# Patient Record
Sex: Female | Born: 1990 | Race: Black or African American | Hispanic: No | Marital: Single | Smoking: Former smoker
Health system: Southern US, Community
[De-identification: ages and names within clinical notes are randomized; demographics above are authoritative.]

## PROBLEM LIST (undated history)

## (undated) DIAGNOSIS — B009 Herpesviral infection, unspecified: Secondary | ICD-10-CM

## (undated) HISTORY — PX: TOOTH EXTRACTION: SUR596

## (undated) HISTORY — PX: NO PAST SURGERIES: SHX2092

---
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
## 2009-01-24 ENCOUNTER — Emergency Department (HOSPITAL_COMMUNITY): Admission: EM | Admit: 2009-01-24 | Discharge: 2009-01-24 | Payer: Self-pay | Admitting: Emergency Medicine

## 2009-04-11 ENCOUNTER — Emergency Department (HOSPITAL_COMMUNITY): Admission: EM | Admit: 2009-04-11 | Discharge: 2009-04-11 | Payer: Self-pay | Admitting: Emergency Medicine

## 2009-08-24 ENCOUNTER — Emergency Department (HOSPITAL_COMMUNITY)
Admission: EM | Admit: 2009-08-24 | Discharge: 2009-08-24 | Payer: Self-pay | Source: Home / Self Care | Admitting: Family Medicine

## 2009-12-25 ENCOUNTER — Emergency Department (HOSPITAL_COMMUNITY)
Admission: EM | Admit: 2009-12-25 | Discharge: 2009-12-25 | Payer: Self-pay | Source: Home / Self Care | Admitting: Family Medicine

## 2010-02-02 ENCOUNTER — Emergency Department (HOSPITAL_COMMUNITY)
Admission: EM | Admit: 2010-02-02 | Discharge: 2010-02-02 | Payer: Self-pay | Source: Home / Self Care | Admitting: Emergency Medicine

## 2010-03-15 LAB — POCT PREGNANCY, URINE

## 2010-03-23 ENCOUNTER — Emergency Department (HOSPITAL_COMMUNITY)
Admission: EM | Admit: 2010-03-23 | Discharge: 2010-03-23 | Discharge status: Against Medical Advice | Payer: Self-pay | Attending: Emergency Medicine | Admitting: Emergency Medicine

## 2010-03-23 DIAGNOSIS — K1370 Unspecified lesions of oral mucosa: Secondary | ICD-10-CM | POA: Insufficient documentation

## 2010-04-09 LAB — URINALYSIS, ROUTINE W REFLEX MICROSCOPIC
Nitrite: NEGATIVE
Protein, ur: NEGATIVE mg/dL
Urobilinogen, UA: 1 mg/dL (ref 0.0–1.0)

## 2010-04-09 LAB — URINE MICROSCOPIC-ADD ON

## 2010-04-09 LAB — CBC
HCT: 40.7 % (ref 36.0–46.0)
Hemoglobin: 13.3 g/dL (ref 12.0–15.0)
RDW: 14 % (ref 11.5–15.5)
WBC: 9.6 10*3/uL (ref 4.0–10.5)

## 2010-04-10 LAB — CBC
HCT: 32.3 % — ABNORMAL LOW (ref 36.0–46.0)
Hemoglobin: 11.9 g/dL — ABNORMAL LOW (ref 12.0–15.0)
MCV: 87.2 fL (ref 78.0–100.0)
RBC: 3.7 MIL/uL — ABNORMAL LOW (ref 3.87–5.11)
RBC: 4.12 MIL/uL (ref 3.87–5.11)
WBC: 12.3 10*3/uL — ABNORMAL HIGH (ref 4.0–10.5)

## 2010-04-10 LAB — WET PREP, GENITAL: Clue Cells Wet Prep HPF POC: NONE SEEN

## 2010-04-14 LAB — URINALYSIS, ROUTINE W REFLEX MICROSCOPIC
Bilirubin Urine: NEGATIVE
Glucose, UA: NEGATIVE mg/dL
Ketones, ur: 15 mg/dL — AB
pH: 6.5 (ref 5.0–8.0)

## 2010-04-19 LAB — URINALYSIS, ROUTINE W REFLEX MICROSCOPIC
Glucose, UA: NEGATIVE mg/dL
Glucose, UA: NEGATIVE mg/dL
Hgb urine dipstick: NEGATIVE
Ketones, ur: NEGATIVE mg/dL
Ketones, ur: 15 mg/dL — AB
Nitrite: NEGATIVE
Nitrite: NEGATIVE
Specific Gravity, Urine: 1.021 (ref 1.005–1.030)
Specific Gravity, Urine: 1.014 (ref 1.005–1.030)
pH: 7.5 (ref 5.0–8.0)
pH: 6.5 (ref 5.0–8.0)

## 2010-04-19 LAB — URINE MICROSCOPIC-ADD ON

## 2010-04-19 LAB — URINE CULTURE: Colony Count: >=100000

## 2010-04-19 LAB — WET PREP, GENITAL: Clue Cells Wet Prep HPF POC: NONE SEEN

## 2010-04-19 LAB — GC/CHLAMYDIA PROBE AMP, GENITAL

## 2010-05-18 NOTE — Discharge Summary (Signed)
NAME:  Tonya Gonzales, Tonya Gonzales                 ACCOUNT NO.:  [id number]   MEDICAL RECORD NO.:  [id number]          PATIENT TYPE:  INP   LOCATION:  9130                          FACILITY:  [facility]   PHYSICIAN:  Sherron Monday, MD        DATE OF BIRTH:  Apr 23, 1990   DATE OF ADMISSION:  07/04/2006  DATE OF DISCHARGE:  07/06/2006                               DISCHARGE SUMMARY   ADMITTING DIAGNOSES:  1. Intrauterine pregnancy at term.  2. Spontaneous rupture of membranes.   DISCHARGE DIAGNOSES:  1. Intrauterine pregnancy at term.  2. Spontaneous rupture of membranes.  3. Delivered via spontaneous vaginal delivery.   HISTORY OF PRESENT ILLNESS:  A 20 year old G1 P0 at 37+2 weeks with SROM  at approximately 4 a.m.  She states she has been constantly leaking  clear fluid since this time.  Good fetal movement, no vaginal bleeding,  occasional contractions.  Her prenatal care was relatively uncomplicated  except early dilatation.  At 24 weeks, she was fingertip dilated.  At  this time, she had a negative fetal fibronectin and a good cervical  length by ultrasound; therefore, she was just followed with routine  care.   PAST MEDICAL HISTORY:  Asthma without hospitalization.   PAST SURGICAL HISTORY:  Not significant.   PAST OB/GYN HISTORY:  1. G1 is the present pregnancy.  2. She has a history of an abnormal Pap smear that was ascus with HPV      with the pregnancy and a normal colposcopy.  This will be followed      up postpartum.  3. 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   MEDICATIONS:  None.   ALLERGIES:  No known drug allergies.   SOCIAL HISTORY:  Denies alcohol, tobacco, or drug use.   FAMILY HISTORY:  Significant for maternal grandmother with coronary  artery disease, hypertension.  Maternal grandmother with lung cancer.  Paternal grandmother with breast cancer and diabetes in her maternal  grandparents.   PRENATAL LABS:  Hemoglobin 12.7, platelets 249,000,  blood type A  positive, antibody screen negative, sickle cell within normal limits,  gonorrhea negative, Chlamydia negative, RPR nonreactive, rubella immune,  hepatitis B surface antigen negative, Glucola 117, hepatitis B surface  antigen positive, quad screen negative.   Ultrasound on April 10, 2006, revealed a normal cervical length.  Ultrasound on February 27, 2006, at 19 and 1 weeks revealed an estimated  fetal weight of 311 g, normal anatomy, posterior placenta, and a female  infant.   PHYSICAL EXAM ON ADMISSION:  VITAL SIGNS:  She is afebrile with vital  signs stable.  GENERAL:  She is no apparent distress.  CARDIOVASCULAR:  Regular rate and rhythm.  LUNGS:  Clear to auscultation bilaterally.  ABDOMEN: soft, fundus nontender.  Fetal heart tones are in the 140s and  reactive.  Contractions are every 2 minutes.  VAGINAL EXAM:  4-5 cm dilated, 70% effaced, and -2 station.  Although  she had a bulging bag of water in the office, SROM had been confirmed by  sterile speculum exam with plus-minus  pool, plus-minus Nitrazine, and  positive fern.   She was admitted to labor and delivery, augmented with Pitocin, given  penicillin for GBS prophylaxis.  Her labor was relatively uncomplicated.  She dilated to complete-complete +2 at approximately 8:00, pushed well  to deliver a viable female infant at 2212 with Apgars of 8 at one minute  and 9 at five minutes and a weight of 7 pounds 12 ounces.  Secondary  perineal laceration repaired with 3-0 Vicryl in typical fashion.  EBL  was less than 50 mL.  The placenta was delivered intact at 2214.  Circumcision for the female infant was discussed with the patient and the  father of the baby who wished to proceed.  Her hospital course was  relatively uncomplicated.  She remained afebrile with stable vital signs  throughout.  On the day of discharge, postpartum day 2, her pain was  well controlled; she was ambulating well, and she had normal lochia.  At   this time, she was discharged to home with routine discharge  instructions and numbers to call if any questions or problems.  Her  hemoglobin decreased from 12.0 to 9.6.  She is A positive, rubella  immune.  She plans to breast feed and has worked with the Hotel manager, and her benefits will be checked for an IUD to be placed at  her postpartum checkup.  She was given prescriptions for Motrin,  prenatal vitamins, and Vicodin and voiced understanding to instructions.      Sherron Monday, MD  Electronically Signed     JB/MEDQ  D:  07/06/2006  T:  07/06/2006  Job:  [id number]

## 2010-09-30 LAB — URINALYSIS, ROUTINE W REFLEX MICROSCOPIC
Glucose, UA: NEGATIVE
Protein, ur: NEGATIVE

## 2010-09-30 LAB — URINE MICROSCOPIC-ADD ON

## 2010-09-30 LAB — WET PREP, GENITAL

## 2010-09-30 LAB — GC/CHLAMYDIA PROBE AMP, GENITAL: Chlamydia, DNA Probe: NEGATIVE

## 2010-10-04 LAB — URINALYSIS, ROUTINE W REFLEX MICROSCOPIC
Bilirubin Urine: NEGATIVE
Ketones, ur: NEGATIVE
Nitrite: NEGATIVE
Protein, ur: NEGATIVE
Urobilinogen, UA: 0.2

## 2010-10-04 LAB — CBC
HCT: 39.5
MCV: 87
RBC: 4.54
WBC: 6.2

## 2010-10-04 LAB — DIFFERENTIAL
Eosinophils Absolute: 0.5
Eosinophils Relative: 8 — ABNORMAL HIGH
Lymphs Abs: 1.9
Monocytes Relative: 8

## 2010-10-04 LAB — POCT I-STAT, CHEM 8
BUN: 6
Creatinine, Ser: 1
Glucose, Bld: 86
Hemoglobin: 13.9
Potassium: 3.6
TCO2: 23

## 2010-10-04 LAB — POCT PREGNANCY, URINE: Preg Test, Ur: NEGATIVE

## 2010-10-05 LAB — DIFFERENTIAL
Basophils Absolute: 0
Basophils Relative: 1
Eosinophils Absolute: 0.2
Eosinophils Relative: 4
Lymphocytes Relative: 42
Lymphs Abs: 2.3
Monocytes Absolute: 0.4
Monocytes Relative: 7
Neutro Abs: 2.6
Neutrophils Relative %: 47

## 2010-10-05 LAB — URINE MICROSCOPIC-ADD ON

## 2010-10-05 LAB — BASIC METABOLIC PANEL
BUN: 6
CO2: 23
Calcium: 9.2
Chloride: 106
Creatinine, Ser: 0.75
GFR calc Af Amer: >60
GFR calc non Af Amer: >60
Glucose, Bld: 93
Potassium: 3.8
Sodium: 137

## 2010-10-05 LAB — CBC
HCT: 37.7
Hemoglobin: 12.6
MCHC: 33.4
MCV: 85.9
Platelets: 280
RBC: 4.39
RDW: 13.2
WBC: 5.5

## 2010-10-05 LAB — URINALYSIS, ROUTINE W REFLEX MICROSCOPIC
Bilirubin Urine: NEGATIVE
Glucose, UA: NEGATIVE
Ketones, ur: 15 — AB
Nitrite: NEGATIVE
Protein, ur: NEGATIVE
Specific Gravity, Urine: 1.036 — ABNORMAL HIGH
Urobilinogen, UA: 1
pH: 6.5

## 2010-10-05 LAB — GC/CHLAMYDIA PROBE AMP, GENITAL
Chlamydia, DNA Probe: NEGATIVE
GC Probe Amp, Genital: NEGATIVE

## 2010-10-05 LAB — POCT PREGNANCY, URINE: Preg Test, Ur: NEGATIVE

## 2010-10-08 LAB — URINALYSIS, ROUTINE W REFLEX MICROSCOPIC
Ketones, ur: >80 mg/dL — AB
Leukocytes, UA: NEGATIVE
Nitrite: NEGATIVE
Protein, ur: 30 mg/dL — AB
pH: 6.5 (ref 5.0–8.0)

## 2010-10-08 LAB — URINE MICROSCOPIC-ADD ON

## 2010-10-08 LAB — POCT PREGNANCY, URINE: Preg Test, Ur: NEGATIVE

## 2010-10-19 LAB — RAPID HIV SCREEN (WH-MAU): Rapid HIV Screen: NONREACTIVE

## 2010-10-19 LAB — RPR: RPR Ser Ql: NONREACTIVE

## 2010-10-19 LAB — CBC
Hemoglobin: 12
MCHC: 33.1
MCHC: 33.7
MCV: 82.7
RBC: 4.37
RDW: 13.1
WBC: 8.5

## 2011-06-04 ENCOUNTER — Emergency Department (HOSPITAL_COMMUNITY): Payer: Self-pay

## 2011-06-04 ENCOUNTER — Encounter (HOSPITAL_COMMUNITY): Payer: Self-pay | Admitting: *Deleted

## 2011-06-04 ENCOUNTER — Emergency Department (HOSPITAL_COMMUNITY)
Admission: EM | Admit: 2011-06-04 | Discharge: 2011-06-05 | Disposition: A | Discharge status: Home / Self Care | Payer: Self-pay | Attending: Emergency Medicine | Admitting: Emergency Medicine

## 2011-06-04 DIAGNOSIS — J45909 Unspecified asthma, uncomplicated: Secondary | ICD-10-CM | POA: Insufficient documentation

## 2011-06-04 DIAGNOSIS — X58XXXA Exposure to other specified factors, initial encounter: Secondary | ICD-10-CM | POA: Insufficient documentation

## 2011-06-04 DIAGNOSIS — S39012A Strain of muscle, fascia and tendon of lower back, initial encounter: Secondary | ICD-10-CM

## 2011-06-04 DIAGNOSIS — Y9367 Activity, basketball: Secondary | ICD-10-CM | POA: Insufficient documentation

## 2011-06-04 DIAGNOSIS — S335XXA Sprain of ligaments of lumbar spine, initial encounter: Secondary | ICD-10-CM | POA: Insufficient documentation

## 2011-06-04 DIAGNOSIS — Y9239 Other specified sports and athletic area as the place of occurrence of the external cause: Secondary | ICD-10-CM | POA: Insufficient documentation

## 2011-06-04 MED ORDER — OXYCODONE-ACETAMINOPHEN 5-325 MG PO TABS: 1.0000 | ORAL_TABLET | Freq: Four times a day (QID) | ORAL | Status: AC | PRN

## 2011-06-04 MED ORDER — CYCLOBENZAPRINE HCL 10 MG PO TABS: 10.0000 mg | ORAL_TABLET | Freq: Two times a day (BID) | ORAL | Status: AC | PRN

## 2011-06-04 MED ORDER — OXYCODONE-ACETAMINOPHEN 5-325 MG PO TABS
1.0000 | ORAL_TABLET | Freq: Once | ORAL | Status: AC
  Administered 2011-06-04: 1 via ORAL
  Filled 2011-06-04: qty 1

## 2011-06-04 MED ORDER — CYCLOBENZAPRINE HCL 10 MG PO TABS
10.0000 mg | ORAL_TABLET | Freq: Once | ORAL | Status: AC
  Administered 2011-06-04: 10 mg via ORAL
  Filled 2011-06-04: qty 1

## 2011-06-04 NOTE — ED Notes (Signed)
C/o lower back pain after falling playing basketball  One hour ago.  She has painin her lower back and legs

## 2011-06-04 NOTE — ED Notes (Signed)
Patient transported to X-ray 

## 2011-06-04 NOTE — ED Provider Notes (Signed)
History     CSN: [id number]  Arrival date & time 06/04/11  2026   First MD Initiated Contact with Patient 06/04/11 2143      Chief Complaint  Patient presents with  . Back Pain    (Consider location/radiation/quality/duration/timing/severity/associated sxs/prior treatment) Patient is a 21 y.o. female presenting with back pain. The history is provided by the patient.  Back Pain  This is a new problem. The current episode started 3 to 5 hours ago. The problem occurs constantly. The problem has not changed since onset.Associated with: She was playing basketball and jumped to shoot a basket and when she came down she landed on her leg abnormally and had pain shooting from her back down to her toe. The pain is present in the lumbar spine. The quality of the pain is described as shooting and stabbing. The pain radiates to the right thigh. The pain is at a severity of 9/10. The pain is severe. The symptoms are aggravated by certain positions, twisting and bending. The pain is the same all the time. Associated symptoms include leg pain. Pertinent negatives include no chest pain, no numbness, no abdominal pain, no bowel incontinence, no dysuria, no paresthesias, no tingling and no weakness. She has tried NSAIDs and ice for the symptoms. The treatment provided no relief.    Past Medical History  Diagnosis Date  . Asthma     History reviewed. No pertinent past surgical history.  No family history on file.  History  Substance Use Topics  . Smoking status: Current Everyday Smoker  . Smokeless tobacco: Not on file  . Alcohol Use: Yes    OB History    Grav Para Term Preterm Abortions TAB SAB Ect Mult Living                  Review of Systems  Cardiovascular: Negative for chest pain.  Gastrointestinal: Negative for abdominal pain and bowel incontinence.  Genitourinary: Negative for dysuria.  Musculoskeletal: Positive for back pain.  Neurological: Negative for tingling, weakness,  numbness and paresthesias.  All other systems reviewed and are negative.    Allergies  Latex and Morphine and related  Home Medications   Current Outpatient Rx  Name Route Sig Dispense Refill  . ACETAMINOPHEN 325 MG PO TABS Oral Take 975 mg by mouth 2 (two) times daily as needed. For mouth pain      BP 124/77  Pulse 82  Temp(Src) 98.7 F (37.1 C) (Oral)  Resp 20  SpO2 100%  LMP 05/28/2011  Physical Exam  Nursing note and vitals reviewed. Constitutional: She is oriented to person, place, and time. She appears well-developed and well-nourished. She appears distressed.  HENT:  Head: Normocephalic and atraumatic.  Mouth/Throat: Oropharynx is clear and moist.  Eyes: Conjunctivae and EOM are normal. Pupils are equal, round, and reactive to light.  Neck: Normal range of motion. Neck supple.  Cardiovascular: Intact distal pulses.   Abdominal: Soft. She exhibits no distension. There is no tenderness. There is no rebound and no guarding.  Musculoskeletal: She exhibits no edema and no tenderness.       Lumbar back: She exhibits decreased range of motion, tenderness and spasm. She exhibits no bony tenderness, no swelling, no deformity and normal pulse.       Back:  Neurological: She is alert and oriented to person, place, and time. She has normal strength. No sensory deficit. Gait normal.  Skin: Skin is warm and dry. No rash noted. No erythema.  Psychiatric: She  has a normal mood and affect. Her behavior is normal.    ED Course  Procedures (including critical care time)  Labs Reviewed - No data to display Dg Lumbar Spine Complete  06/04/2011  *RADIOLOGY REPORT*  Clinical Data: Pain in the right side and right hip after basketball injury.  LUMBAR SPINE - COMPLETE 4+ VIEW  Comparison: None.  Findings: Five lumbar type vertebrae.  Normal alignment of the lumbar vertebrae and facet joints.  No vertebral compression deformities.  Intervertebral disc space heights are preserved.  No focal  bone lesion or bone destruction.  Bone cortex and trabecular architecture appear intact.  IMPRESSION: No displaced fractures identified.  Original Report Authenticated By: Marlon Pel, M.D.     No diagnosis found.    MDM   Patient was playing basketball tonight and when she came down on her leg she developed a severe pain in the right side of her back that has been ongoing for the last 3 hours. She has no neuro symptoms and she is neurovascularly intact. Normal muscle function and gait. 2+ pulses and normal capillary refill. Patient is point tender to the right paralumbar spine. Because this happened while she was playing basketball and did sustain an injury we'll get a plain film of the L-spine to further evaluate. She is not having any bowel or bladder incontinence.  11:33 PM Films are negative and pain is improved      Gwyneth Sprout, MD 06/04/11 2333

## 2011-06-04 NOTE — ED Notes (Signed)
Pt was playing basketball this evening.  She jumped up and came down, landing on her R foot.  When she landed, she felt a shooting pain start in her R foot and move up to her thigh and lower R back.  Denies losing control of bowels.  LMP 2 weeks prior and pt has implant.

## 2011-10-12 ENCOUNTER — Encounter (HOSPITAL_COMMUNITY): Payer: Self-pay | Admitting: *Deleted

## 2011-10-12 ENCOUNTER — Emergency Department (INDEPENDENT_AMBULATORY_CARE_PROVIDER_SITE_OTHER)
Admission: EM | Admit: 2011-10-12 | Discharge: 2011-10-12 | Disposition: A | Discharge status: Home / Self Care | Payer: Self-pay | Source: Home / Self Care | Attending: Emergency Medicine | Admitting: Emergency Medicine

## 2011-10-12 DIAGNOSIS — N730 Acute parametritis and pelvic cellulitis: Secondary | ICD-10-CM

## 2011-10-12 DIAGNOSIS — K0530 Chronic periodontitis, unspecified: Secondary | ICD-10-CM

## 2011-10-12 DIAGNOSIS — K0401 Reversible pulpitis: Secondary | ICD-10-CM

## 2011-10-12 LAB — POCT URINALYSIS DIP (DEVICE)
Bilirubin Urine: NEGATIVE
Ketones, ur: 15 mg/dL — AB
Protein, ur: NEGATIVE mg/dL
Specific Gravity, Urine: 1.015 (ref 1.005–1.030)
pH: 7 (ref 5.0–8.0)

## 2011-10-12 LAB — WET PREP, GENITAL
Trich, Wet Prep: NONE SEEN
Yeast Wet Prep HPF POC: NONE SEEN

## 2011-10-12 LAB — POCT PREGNANCY, URINE: Preg Test, Ur: NEGATIVE

## 2011-10-12 MED ORDER — CEFTRIAXONE SODIUM 250 MG IJ SOLR
INTRAMUSCULAR | Status: AC
  Filled 2011-10-12: qty 250

## 2011-10-12 MED ORDER — AZITHROMYCIN 250 MG PO TABS
ORAL_TABLET | ORAL | Status: AC
  Filled 2011-10-12: qty 4

## 2011-10-12 MED ORDER — AZITHROMYCIN 250 MG PO TABS
1000.0000 mg | ORAL_TABLET | Freq: Once | ORAL | Status: AC
  Administered 2011-10-12: 1000 mg via ORAL

## 2011-10-12 MED ORDER — METRONIDAZOLE 500 MG PO TABS: 500.0000 mg | ORAL_TABLET | Freq: Three times a day (TID) | ORAL | Status: DC

## 2011-10-12 MED ORDER — PENICILLIN V POTASSIUM 500 MG PO TABS: 500.0000 mg | ORAL_TABLET | Freq: Three times a day (TID) | ORAL | Status: DC

## 2011-10-12 MED ORDER — CEFTRIAXONE SODIUM 250 MG IJ SOLR
250.0000 mg | Freq: Once | INTRAMUSCULAR | Status: AC
  Administered 2011-10-12: 250 mg via INTRAMUSCULAR

## 2011-10-12 NOTE — ED Provider Notes (Signed)
Chief Complaint  Patient presents with  . Dental Pain    History of Present Illness:    The patient is a 21 year old female who comes in today, thinking she might be pregnant and also for some dental pain.  Her last menstrual period was September 9 and lasted 10 days. She is sexually active and has an Implanad implant which expired September 2. The past week she's had intermittent abdominal pain in the midline lower bowel area, worse when she sits and not better with anything. It's described as a crampy pain rated 6-7/10 in intensity at the most and now is a 0. She also has had intermittent diarrhea for the past week, urinary frequency, and a clear discharge. She's had nausea and vomiting, fatigue, mood swings, breast swelling and tenderness. She took a pregnancy test that was negative.  She also has had a six-day history of pain in the left upper and lower molars. She has pain with chewing. No swelling of the gingiva or the cheek. No trouble swallowing or breathing.  Review of Systems:  Other than noted above, the patient denies any of the following symptoms: Constitutional:  No fever, chills, fatigue, weight loss or anorexia. Lungs:  No cough or shortness of breath. Heart:  No chest pain, palpitations, syncope or edema.  No cardiac history. Abdomen:  No nausea, vomiting, hematememesis, melena, diarrhea, or hematochezia. GU:  No dysuria, frequency, urgency, or hematuria. Gyn:  No vaginal discharge, itching, abnormal bleeding, dyspareunia, or pelvic pain.  PMFSH:  Past medical history, family history, social history, meds, and allergies were reviewed along with nurse's notes.  No prior abdominal surgeries, past history of GI problems, STDs or GYN problems.  No history of aspirin or NSAID use.  No excessive alcohol intake.  Physical Exam:   Vital signs:  BP 133/77  Pulse 63  Temp 98.9 F (37.2 C) (Oral)  Resp 18  SpO2 100%  LMP 09/12/2011 Gen:  Alert, oriented, in no distress. ENT: She  has a partially erupted wisdom tooth in the left upper dentition which is tender to touch. She also has a cavity of the left lower second molar which is tender. Lungs:  Breath sounds clear and equal bilaterally.  No wheezes, rales or rhonchi. Heart:  Regular rhythm.  No gallops or murmers.   Abdomen:  Soft, flat, and nondistended. There is slight suprapubic tenderness to palpation. No guarding or rebound. No organomegaly or mass. Bowel sounds are normally active. Pelvic:  Normal external genitalia, vaginal and cervical mucosa are normal. She has moderate pain on cervical motion, moderate pain to palpation of the uterus, and moderate adnexal tenderness to palpation bilaterally without a mass. Skin:  Clear, warm and dry.  No rash.  Labs:   Results for orders placed during the hospital encounter of 10/12/11  WET PREP, GENITAL      Component Value Range   Yeast Wet Prep HPF POC NONE SEEN  NONE SEEN   Trich, Wet Prep NONE SEEN  NONE SEEN   Clue Cells Wet Prep HPF POC MODERATE (*) NONE SEEN   WBC, Wet Prep HPF POC NONE SEEN  NONE SEEN  POCT URINALYSIS DIP (DEVICE)      Component Value Range   Glucose, UA NEGATIVE  NEGATIVE mg/dL   Bilirubin Urine NEGATIVE  NEGATIVE   Ketones, ur 15 (*) NEGATIVE mg/dL   Specific Gravity, Urine 1.015  1.005 - 1.030   Hgb urine dipstick TRACE (*) NEGATIVE   pH 7.0  5.0 - 8.0  Protein, ur NEGATIVE  NEGATIVE mg/dL   Urobilinogen, UA 2.0 (*) 0.0 - 1.0 mg/dL   Nitrite NEGATIVE  NEGATIVE   Leukocytes, UA NEGATIVE  NEGATIVE  POCT PREGNANCY, URINE      Component Value Range   Preg Test, Ur NEGATIVE  NEGATIVE    Other Labs Obtained at Urgent Care Center:  GC and Chlamydia DNA probes were obtained.  Results are pending at this time and we will call about any positive results.  Course in Urgent Care Center:   She was given Rocephin 250 mg IM and azithromycin 1000 mg by mouth and tolerated this well without any immediate side effects.  Assessment:  The primary  encounter diagnosis was PID (acute pelvic inflammatory disease). Diagnoses of Pulpitis and Pericoronitis were also pertinent to this visit.  Plan:   1.  The following meds were prescribed:   New Prescriptions   METRONIDAZOLE (FLAGYL) 500 MG TABLET    Take 1 tablet (500 mg total) by mouth 3 (three) times daily.   PENICILLIN V POTASSIUM (VEETID) 500 MG TABLET    Take 1 tablet (500 mg total) by mouth 3 (three) times daily.   2.  The patient was instructed in symptomatic care and handouts were given. 3.  The patient was told to return if becoming worse in any way, to return for a recheck in 2 days, and given some red flag symptoms that would indicate earlier return.    Reuben Likes, MD 10/12/11 [phone]

## 2011-10-12 NOTE — ED Notes (Signed)
Pt  Has  A  Toothache  As well  As  Symptoms  Of  Pre  Which  Include    Weakness  Low  Back pain as  Well  As  Low abd  Pain     With  The  Symptoms  X  1  Week        denys  Any  Bleeding          She  Reports  She  Took  Home  preg  Test  Which  Was  Neg     lmp  1  Month  Ago

## 2011-10-13 ENCOUNTER — Telehealth (HOSPITAL_COMMUNITY): Payer: Self-pay | Admitting: *Deleted

## 2011-10-13 NOTE — ED Notes (Signed)
GC neg., Chlamydia pos., Wet prep: mod. clue cells.  Pt. adequately treated with Zithromax and Flagyl.  I called pt. and VM was not set up. I called contact Mom and left a message to have pt. call.  Pt. called back in a few minutes.  Pt. verified x 2 and given results.  Pt. told she was adequately treated for Chlamydia with the Zithromax and the bacterial vaginosis with the Flagyl.  Pt. instructed to notify their partner, no sex for 1 week and to practice safe sex. Pt. told they can get HIV testing at the [facility]. STD clinic, by appointment.  [facility]HS form completed and faxed to the [facility]. Vassie Moselle 10/13/2011

## 2011-11-04 ENCOUNTER — Emergency Department (HOSPITAL_COMMUNITY)
Admission: EM | Admit: 2011-11-04 | Discharge: 2011-11-04 | Disposition: A | Discharge status: Home / Self Care | Payer: Self-pay | Attending: Emergency Medicine | Admitting: Emergency Medicine

## 2011-11-04 ENCOUNTER — Encounter (HOSPITAL_COMMUNITY): Payer: Self-pay | Admitting: Family Medicine

## 2011-11-04 DIAGNOSIS — K089 Disorder of teeth and supporting structures, unspecified: Secondary | ICD-10-CM | POA: Insufficient documentation

## 2011-11-04 DIAGNOSIS — Z91040 Latex allergy status: Secondary | ICD-10-CM | POA: Insufficient documentation

## 2011-11-04 DIAGNOSIS — J45909 Unspecified asthma, uncomplicated: Secondary | ICD-10-CM | POA: Insufficient documentation

## 2011-11-04 DIAGNOSIS — K0889 Other specified disorders of teeth and supporting structures: Secondary | ICD-10-CM

## 2011-11-04 DIAGNOSIS — F17200 Nicotine dependence, unspecified, uncomplicated: Secondary | ICD-10-CM | POA: Insufficient documentation

## 2011-11-04 DIAGNOSIS — Z885 Allergy status to narcotic agent status: Secondary | ICD-10-CM | POA: Insufficient documentation

## 2011-11-04 MED ORDER — ACETAMINOPHEN 325 MG PO TABS
650.0000 mg | ORAL_TABLET | Freq: Once | ORAL | Status: AC
  Administered 2011-11-04: 650 mg via ORAL
  Filled 2011-11-04: qty 2

## 2011-11-04 MED ORDER — PENICILLIN V POTASSIUM 250 MG/5ML PO SOLR: 500.0000 mg | Freq: Four times a day (QID) | ORAL | Status: AC

## 2011-11-04 MED ORDER — PENICILLIN V POTASSIUM 500 MG PO TABS: 500.0000 mg | ORAL_TABLET | Freq: Four times a day (QID) | ORAL | Status: AC

## 2011-11-04 MED ORDER — OXYCODONE-ACETAMINOPHEN 5-325 MG PO TABS: 1.0000 | ORAL_TABLET | ORAL | Status: DC | PRN

## 2011-11-04 NOTE — ED Provider Notes (Signed)
History   This chart was scribed for Flint Melter, MD by Charolett Bumpers . The patient was seen in room TR08C/TR08C. Patient's care was started at 1115.   CSN: [id number]  Arrival date & time 11/04/11  1036   First MD Initiated Contact with Patient 11/04/11 1115      Chief Complaint  Patient presents with  . Dental Pain    The history is provided by the patient. No language interpreter was used.   Tonya Gonzales is a 21 y.o. female who presents to the Emergency Department complaining of left upper dental pain that worsened last night around 2200 while she was eating chips. She states that the tooth is broken. She denies any pain with opening her mouth, fevers, chills, nausea, vomiting. She reports that she was last seen by a dentist in 2012.   Past Medical History  Diagnosis Date  . Asthma     History reviewed. No pertinent past surgical history.  No family history on file.  History  Substance Use Topics  . Smoking status: Current Every Day Smoker  . Smokeless tobacco: Not on file  . Alcohol Use: Yes    OB History    Grav Para Term Preterm Abortions TAB SAB Ect Mult Living                  Review of Systems  Constitutional: Negative for fever and chills.  HENT: Positive for dental problem.   Respiratory: Negative for shortness of breath.   Gastrointestinal: Negative for nausea and vomiting.  Neurological: Negative for weakness.  All other systems reviewed and are negative.    Allergies  Latex and Morphine and related  Home Medications   Current Outpatient Rx  Name Route Sig Dispense Refill  . ACETAMINOPHEN 325 MG PO TABS Oral Take 975 mg by mouth 2 (two) times daily as needed. For mouth pain    . OXYCODONE-ACETAMINOPHEN 5-325 MG PO TABS Oral Take 1 tablet by mouth every 4 (four) hours as needed for pain. 15 tablet 0  . PENICILLIN V POTASSIUM 500 MG PO TABS Oral Take 1 tablet (500 mg total) by mouth 4 (four) times daily. 28 tablet 0    BP 120/74   Pulse 86  Temp 98.3 F (36.8 C) (Oral)  Resp 20  SpO2 99%  LMP 09/12/2011  Physical Exam  Nursing note and vitals reviewed. Constitutional: She is oriented to person, place, and time. She appears well-developed and well-nourished. No distress.  HENT:  Head: Normocephalic and atraumatic. No trismus in the jaw.       Left upper molar with large cavity and fracture, missing 30%. No abscess, swelling or drainage noted. No trismus.   Eyes: EOM are normal.  Neck: Neck supple. No tracheal deviation present.  Cardiovascular: Normal rate.   Pulmonary/Chest: Effort normal. No respiratory distress.  Musculoskeletal: Normal range of motion.  Neurological: She is alert and oriented to person, place, and time.  Skin: Skin is warm and dry.  Psychiatric: She has a normal mood and affect. Her behavior is normal.    ED Course  Procedures (including critical care time)  DIAGNOSTIC STUDIES: Oxygen Saturation is 99% on room air, normal by my interpretation.    COORDINATION OF CARE:  11:27-Discussed planned course of treatment with the patient including d/c home with Veetid and Percocet/Roxicet and referral to dentist oncall, who is agreeable at this time.   Nursing notes, applicable records and vitals reviewed.  1. Toothache       MDM  Dental pain with tooth fracture and cavity. Doubt infection, currently. She is stable for discharge with outpatient dental followup.   I personally performed the services described in this documentation, which was scribed in my presence. The recorded information has been reviewed and considered.    Plan: Home Medications- Percocet and penicillin; Home Treatments- heat to affected area; Recommended follow up- dental followup as soon as possible        Flint Melter, MD 11/04/11 1145

## 2011-11-04 NOTE — ED Notes (Signed)
Pt. States last night around 2200 she was eating some chips and a piece of her upper back left molar broke off. She woke up this am and the left side of her face was hurting.

## 2012-02-06 ENCOUNTER — Emergency Department (HOSPITAL_COMMUNITY)
Admission: EM | Admit: 2012-02-06 | Discharge: 2012-02-06 | Discharge status: Against Medical Advice | Payer: Self-pay | Attending: Emergency Medicine | Admitting: Emergency Medicine

## 2012-02-06 ENCOUNTER — Encounter (HOSPITAL_COMMUNITY): Payer: Self-pay | Admitting: Emergency Medicine

## 2012-02-06 DIAGNOSIS — N898 Other specified noninflammatory disorders of vagina: Secondary | ICD-10-CM | POA: Insufficient documentation

## 2012-02-06 DIAGNOSIS — Z3202 Encounter for pregnancy test, result negative: Secondary | ICD-10-CM | POA: Insufficient documentation

## 2012-02-06 DIAGNOSIS — R109 Unspecified abdominal pain: Secondary | ICD-10-CM | POA: Insufficient documentation

## 2012-02-06 LAB — URINALYSIS, ROUTINE W REFLEX MICROSCOPIC
Bilirubin Urine: NEGATIVE
Hgb urine dipstick: NEGATIVE
Nitrite: NEGATIVE
Specific Gravity, Urine: 1.023 (ref 1.005–1.030)
pH: 7.5 (ref 5.0–8.0)

## 2012-02-06 LAB — URINE MICROSCOPIC-ADD ON

## 2012-02-06 LAB — POCT PREGNANCY, URINE: Preg Test, Ur: NEGATIVE

## 2012-02-06 NOTE — ED Notes (Signed)
Unable to locate patient.

## 2012-02-06 NOTE — ED Notes (Signed)
Pt c/o lower abd pain with vaginal discharge; pt sts some odor; pt sts LMP was 12/12/11

## 2012-02-06 NOTE — ED Notes (Signed)
Unable to locate patient x2.

## 2012-02-07 LAB — URINE CULTURE

## 2012-02-11 NOTE — ED Provider Notes (Signed)
I did not evaluate this pt. She left after triage but before MSE could be performed   Lyanne Co, MD 02/11/12 1447

## 2012-02-23 ENCOUNTER — Encounter (HOSPITAL_COMMUNITY): Payer: Self-pay | Admitting: *Deleted

## 2012-02-23 ENCOUNTER — Emergency Department (HOSPITAL_COMMUNITY)
Admission: EM | Admit: 2012-02-23 | Discharge: 2012-02-24 | Disposition: A | Discharge status: Home / Self Care | Payer: Self-pay | Attending: Emergency Medicine | Admitting: Emergency Medicine

## 2012-02-23 DIAGNOSIS — B9689 Other specified bacterial agents as the cause of diseases classified elsewhere: Secondary | ICD-10-CM

## 2012-02-23 DIAGNOSIS — Z202 Contact with and (suspected) exposure to infections with a predominantly sexual mode of transmission: Secondary | ICD-10-CM | POA: Insufficient documentation

## 2012-02-23 DIAGNOSIS — J45909 Unspecified asthma, uncomplicated: Secondary | ICD-10-CM | POA: Insufficient documentation

## 2012-02-23 DIAGNOSIS — K029 Dental caries, unspecified: Secondary | ICD-10-CM | POA: Insufficient documentation

## 2012-02-23 DIAGNOSIS — F17200 Nicotine dependence, unspecified, uncomplicated: Secondary | ICD-10-CM | POA: Insufficient documentation

## 2012-02-23 DIAGNOSIS — N760 Acute vaginitis: Secondary | ICD-10-CM | POA: Insufficient documentation

## 2012-02-23 DIAGNOSIS — N898 Other specified noninflammatory disorders of vagina: Secondary | ICD-10-CM | POA: Insufficient documentation

## 2012-02-23 DIAGNOSIS — Z3202 Encounter for pregnancy test, result negative: Secondary | ICD-10-CM | POA: Insufficient documentation

## 2012-02-23 LAB — URINALYSIS, ROUTINE W REFLEX MICROSCOPIC
Ketones, ur: 15 mg/dL — AB
Leukocytes, UA: NEGATIVE
Nitrite: NEGATIVE
Protein, ur: 30 mg/dL — AB
pH: 6 (ref 5.0–8.0)

## 2012-02-23 LAB — URINE MICROSCOPIC-ADD ON

## 2012-02-23 NOTE — ED Notes (Addendum)
Lt. Lower jaw of mouth aching. X 2 teeth on lt. Lower, towards front hurting. Also, here for std screening. Pt. States, "whitish d/c from vagina."

## 2012-02-24 LAB — WET PREP, GENITAL

## 2012-02-24 LAB — GC/CHLAMYDIA PROBE AMP: GC Probe RNA: NEGATIVE

## 2012-02-24 MED ORDER — HYDROCODONE-ACETAMINOPHEN 5-325 MG PO TABS
1.0000 | ORAL_TABLET | Freq: Once | ORAL | Status: AC
  Administered 2012-02-24: 1 via ORAL
  Filled 2012-02-24: qty 1

## 2012-02-24 MED ORDER — PENICILLIN V POTASSIUM 500 MG PO TABS: 500.0000 mg | ORAL_TABLET | Freq: Three times a day (TID) | ORAL | Status: DC

## 2012-02-24 MED ORDER — IBUPROFEN 800 MG PO TABS
800.0000 mg | ORAL_TABLET | Freq: Once | ORAL | Status: AC
  Administered 2012-02-24: 800 mg via ORAL
  Filled 2012-02-24: qty 1

## 2012-02-24 MED ORDER — IBUPROFEN 800 MG PO TABS: 800.0000 mg | ORAL_TABLET | Freq: Three times a day (TID) | ORAL | Status: DC | PRN

## 2012-02-24 MED ORDER — HYDROCODONE-ACETAMINOPHEN 5-325 MG PO TABS: 2.0000 | ORAL_TABLET | Freq: Four times a day (QID) | ORAL | Status: DC | PRN

## 2012-02-24 MED ORDER — METRONIDAZOLE 0.75 % VA GEL: 1.0000 | Freq: Two times a day (BID) | VAGINAL | Status: DC

## 2012-02-24 MED ORDER — PENICILLIN V POTASSIUM 250 MG PO TABS
500.0000 mg | ORAL_TABLET | Freq: Once | ORAL | Status: AC
  Administered 2012-02-24: 500 mg via ORAL
  Filled 2012-02-24: qty 2

## 2012-02-24 NOTE — ED Provider Notes (Signed)
History     CSN: [id number]  Arrival date & time 02/23/12  2257   First MD Initiated Contact with Patient 02/24/12 0001      Chief Complaint  Patient presents with  . Dental Pain  . Exposure to STD    (Consider location/radiation/quality/duration/timing/severity/associated sxs/prior treatment) HPI 22 year old female presents to emergency room with complaint of tooth pain and vaginal discharge. She reports onset of tooth pain 2 days ago. She's had no fevers. She reports she's had prior dental abscesses, and this feels as though it is starting. Pain is in mid left lower jaw. She does not have a dentist. She's been taking Excedrin Migraine to help with pain. Patient also concerned about vaginal discharge. She was treated about 3 weeks ago for bacterial vaginosis and Trichomonas. She reports after she stopped her period 2 days ago she began to have a thick white discharge. She is actually active. She reports she uses condoms each time. She reports her partner was treated at the same time she was for Trichomonas. Past Medical History  Diagnosis Date  . Asthma     History reviewed. No pertinent past surgical history.  No family history on file.  History  Substance Use Topics  . Smoking status: Current Every Day Smoker  . Smokeless tobacco: Not on file  . Alcohol Use: Yes    OB History   Grav Para Term Preterm Abortions TAB SAB Ect Mult Living                  Review of Systems  All other systems reviewed and are negative.    Allergies  Latex and Morphine and related  Home Medications   Current Outpatient Rx  Name  Route  Sig  Dispense  Refill  . aspirin-acetaminophen-caffeine (EXCEDRIN MIGRAINE) 250-250-65 MG per tablet   Oral   Take 1 tablet by mouth every 6 (six) hours as needed for pain.           BP 124/71  Pulse 86  Temp(Src) 98.2 F (36.8 C) (Oral)  Resp 14  SpO2 100%  LMP 02/17/2012  Physical Exam  Nursing note and vitals  reviewed. Constitutional: She is oriented to person, place, and time. She appears well-developed and well-nourished. She appears distressed (uncomfortable appearing).  HENT:  Head: Normocephalic and atraumatic.  Nose: Nose normal.  Mouth/Throat: Oropharynx is clear and moist.  Overall poor dentition, premolar and first molar on the left lower jaw noted to have caries. There is no fluctuance around the teeth, no erythema. There is no lymphadenopathy noted there is no trismus  Eyes: Conjunctivae and EOM are normal. Pupils are equal, round, and reactive to light.  Neck: Normal range of motion. Neck supple. No JVD present. No tracheal deviation present. No thyromegaly present.  Cardiovascular: Normal rate, regular rhythm, normal heart sounds and intact distal pulses.  Exam reveals no gallop and no friction rub.   No murmur heard. Pulmonary/Chest: Effort normal and breath sounds normal. No stridor. No respiratory distress. She has no wheezes. She has no rales. She exhibits no tenderness.  Abdominal: Soft. Bowel sounds are normal. She exhibits no distension and no mass. There is no tenderness. There is no rebound and no guarding.  Genitourinary:  External genitalia normal Vagina with White discharge Cervix closed no lesions No cervical motion tenderness Adnexa palpated, no masses or tenderness noted Bladder palpated no tenderness Uterus palpated no masses or tenderness    Musculoskeletal: Normal range of motion. She exhibits no edema and  no tenderness.  Lymphadenopathy:    She has no cervical adenopathy.  Neurological: She is alert and oriented to person, place, and time. She exhibits normal muscle tone. Coordination normal.  Skin: Skin is warm and dry. No rash noted. No erythema. No pallor.  Psychiatric: She has a normal mood and affect. Her behavior is normal. Judgment and thought content normal.    ED Course  Procedures (including critical care time)  Labs Reviewed  URINALYSIS,  ROUTINE W REFLEX MICROSCOPIC - Abnormal; Notable for the following:    Color, Urine AMBER (*)    APPearance CLOUDY (*)    Specific Gravity, Urine 1.038 (*)    Hgb urine dipstick MODERATE (*)    Bilirubin Urine SMALL (*)    Ketones, ur 15 (*)    Protein, ur 30 (*)    All other components within normal limits  URINE MICROSCOPIC-ADD ON - Abnormal; Notable for the following:    Squamous Epithelial / LPF FEW (*)    Bacteria, UA FEW (*)    Crystals CA OXALATE CRYSTALS (*)    All other components within normal limits  WET PREP, GENITAL  GC/CHLAMYDIA PROBE AMP   No results found.   1. Pain due to dental caries   2. Bacterial vaginosis       MDM  22 year old female with dental pain as well as vaginal discharge. Will start on antibiotics and referred to a dentist as well as provide pain medications. We'll do pelvic exam and test for gonorrhea Chlamydia, as well as get wet prep.       Olivia Mackie, MD 02/24/12 [phone]

## 2012-05-22 ENCOUNTER — Encounter (HOSPITAL_COMMUNITY): Payer: Self-pay | Admitting: Emergency Medicine

## 2012-05-22 ENCOUNTER — Emergency Department (HOSPITAL_COMMUNITY)
Admission: EM | Admit: 2012-05-22 | Discharge: 2012-05-22 | Disposition: A | Discharge status: Home / Self Care | Payer: BC Managed Care – PPO | Attending: Emergency Medicine | Admitting: Emergency Medicine

## 2012-05-22 DIAGNOSIS — K089 Disorder of teeth and supporting structures, unspecified: Secondary | ICD-10-CM | POA: Insufficient documentation

## 2012-05-22 DIAGNOSIS — R110 Nausea: Secondary | ICD-10-CM | POA: Insufficient documentation

## 2012-05-22 DIAGNOSIS — K0381 Cracked tooth: Secondary | ICD-10-CM | POA: Insufficient documentation

## 2012-05-22 DIAGNOSIS — K029 Dental caries, unspecified: Secondary | ICD-10-CM | POA: Insufficient documentation

## 2012-05-22 DIAGNOSIS — R51 Headache: Secondary | ICD-10-CM | POA: Insufficient documentation

## 2012-05-22 DIAGNOSIS — F17200 Nicotine dependence, unspecified, uncomplicated: Secondary | ICD-10-CM | POA: Insufficient documentation

## 2012-05-22 DIAGNOSIS — Z91040 Latex allergy status: Secondary | ICD-10-CM | POA: Insufficient documentation

## 2012-05-22 DIAGNOSIS — K0889 Other specified disorders of teeth and supporting structures: Secondary | ICD-10-CM

## 2012-05-22 DIAGNOSIS — J45909 Unspecified asthma, uncomplicated: Secondary | ICD-10-CM | POA: Insufficient documentation

## 2012-05-22 DIAGNOSIS — R6883 Chills (without fever): Secondary | ICD-10-CM | POA: Insufficient documentation

## 2012-05-22 MED ORDER — BUPIVACAINE-EPINEPHRINE PF 0.5-1:200000 % IJ SOLN
1.8000 mL | Freq: Once | INTRAMUSCULAR | Status: AC
  Administered 2012-05-22: 9 mg

## 2012-05-22 MED ORDER — HYDROCODONE-ACETAMINOPHEN 5-325 MG PO TABS: 1.0000 | ORAL_TABLET | ORAL | Status: DC | PRN

## 2012-05-22 MED ORDER — IBUPROFEN 400 MG PO TABS
800.0000 mg | ORAL_TABLET | Freq: Once | ORAL | Status: AC
  Administered 2012-05-22: 800 mg via ORAL
  Filled 2012-05-22: qty 2

## 2012-05-22 MED ORDER — PENICILLIN V POTASSIUM 500 MG PO TABS: 500.0000 mg | ORAL_TABLET | Freq: Four times a day (QID) | ORAL | Status: DC

## 2012-05-22 NOTE — ED Notes (Signed)
Pt c/o left lower tooth pain that started 2 days ago. Stated that it is starting to make her head hurt.

## 2012-05-22 NOTE — Discharge Instructions (Signed)
You have a dental injury. Use the resource guide listed below to help you find a dentist if you do not already have one to followup with. It is very important that you get evaluated by a dentist as soon as possible. Call tomorrow to schedule an appointment. Use your pain medication as prescribed and do not operate heavy machinery while on pain medication. Note that your pain medication contains acetaminophen (Tylenol) & its is not reccommended that you use additional acetaminophen (Tylenol) while taking this medication. Take your full course of antibiotics. Read the instructions below.  Eat a soft or liquid diet and rinse your mouth out after meals with warm water. You should see a dentist or return here at once if you have increased swelling, increased pain or uncontrolled bleeding from the site of your injury.   SEEK MEDICAL CARE IF:   You have increased pain not controlled with medicines.   You have swelling around your tooth, in your face or neck.   You have bleeding which starts, continues, or gets worse.   You have a fever >101  If you are unable to open your mouth  RESOURCE GUIDE  Dental Problems  Patients with Medicaid: [facility]                     [facility] [address]                                           [address] Phone:  [phone]                                                  Phone:  [phone]  If unable to pay or uninsured, contact:  [facility] or [facility] to become qualified for the adult dental clinic.  Chronic Pain Problems Contact [facility]  [phone] Patients need to be referred by their primary care doctor.  Insufficient Money for Medicine Contact [employer]:  call "211" or [facility] 271-5999.  No Primary Care Doctor Call [facility]  832-8000 Other agencies that provide inexpensive medical care    [facility]  [phone]    [facility]  [phone]    [facility]  [phone]    [facility]  [phone]    [facility]  [phone]    [facility]  [phone]  Psychological Services [facility]  [phone] [facility]  [phone] [facility]   800 853-5163 (emergency services [phone])  Substance Abuse Resources [facility]  336-882-2125 [facility] 336-784-9470 [facility] 336-285-9073 [facility] 336-845-3988 Residential & Outpatient Substance Abuse Program  800-659-3381  Abuse/Neglect [place] Child Abuse Hotline (336) 641-3795 [place] Child Abuse Hotline 800-378-5315 (After Hours)  Emergency Shelter [facility] (336) 271-5985  [facility] (336) 275-9566 [facility] (704) 372-4663  MRSA Hotline #:   832-7006    [place] Resources  [facility] [place]     [employer]                            [facility] [address] [place]                       [address]      [address]  [place]                                                [place]                            [place] Phone:  [phone]                                   Phone:  [phone]                 Phone:  [phone]  [facility] Phone:  [phone]  [place] Child Abuse Hotline (336) 342-1394 (336) 342-3537 (After Hours)    

## 2012-05-22 NOTE — ED Provider Notes (Signed)
History  This chart was scribed for non-physician practitioner Dierdre Forth, PA-C working with Dione Booze, MD, by Candelaria Stagers, ED Scribe. This patient was seen in room TR11C/TR11C and the patient's care was started at 6:02 PM   CSN: [id number]  Arrival date & time 05/22/12  1721   First MD Initiated Contact with Patient 05/22/12 1801      Chief Complaint  Patient presents with  . Dental Pain     The history is provided by the patient. No language interpreter was used.   HPI Comments: Tonya Gonzales is a 22 y.o. female who presents to the Emergency Department complaining of constant left lower dental pain that started about two days ago and became worse today.  Pt is also experiencing an associated headache, chills, and nausea.  She denies fever or ear pain.  Pt has taken aspirin with no relief.   Eating and getting up from sitting position makes the pain worse.    Past Medical History  Diagnosis Date  . Asthma     History reviewed. No pertinent past surgical history.  No family history on file.  History  Substance Use Topics  . Smoking status: Current Every Day Smoker -- 0.50 packs/day  . Smokeless tobacco: Not on file  . Alcohol Use: Yes    OB History   Grav Para Term Preterm Abortions TAB SAB Ect Mult Living                  Review of Systems  HENT: Positive for dental problem (left lower dental pain ). Negative for ear pain.   Gastrointestinal: Positive for nausea.  Neurological: Positive for headaches.  All other systems reviewed and are negative.    Allergies  Latex and Morphine and related  Home Medications   Current Outpatient Rx  Name  Route  Sig  Dispense  Refill  . HYDROcodone-acetaminophen (NORCO/VICODIN) 5-325 MG per tablet   Oral   Take 1 tablet by mouth every 4 (four) hours as needed for pain.   15 tablet   0   . penicillin v potassium (VEETID) 500 MG tablet   Oral   Take 1 tablet (500 mg total) by mouth 4 (four) times daily.   40 tablet   0     BP 119/78  Pulse 68  Temp(Src) 97.7 F (36.5 C) (Oral)  Resp 18  SpO2 100%  LMP 05/08/2012  Physical Exam  Nursing note and vitals reviewed. Constitutional: She appears well-developed and well-nourished.  HENT:  Head: Normocephalic.  Right Ear: Tympanic membrane, external ear and ear canal normal.  Left Ear: Tympanic membrane, external ear and ear canal normal.  Nose: Nose normal. Right sinus exhibits no maxillary sinus tenderness and no frontal sinus tenderness. Left sinus exhibits no maxillary sinus tenderness and no frontal sinus tenderness.  Mouth/Throat: Uvula is midline, oropharynx is clear and moist and mucous membranes are normal. No oral lesions. Abnormal dentition. Dental caries present. No edematous or lacerations. No oropharyngeal exudate, posterior oropharyngeal edema, posterior oropharyngeal erythema or tonsillar abscesses.  Tooth 20 broken with dental caries at base. Multiple tooth extractions throughout.  Multiple dental caries. Tooth next to tooth 20 is broken.  Mild erythema.  No gross abscess.   Eyes: Conjunctivae are normal. Pupils are equal, round, and reactive to light. Right eye exhibits no discharge. Left eye exhibits no discharge.  Neck: Normal range of motion. Neck supple.  Cardiovascular: Normal rate, regular rhythm and normal heart sounds.   Pulmonary/Chest: Effort  normal and breath sounds normal. No respiratory distress. She has no wheezes.  Abdominal: Soft. Bowel sounds are normal. She exhibits no distension. There is no tenderness.  Lymphadenopathy:    She has no cervical adenopathy.  Neurological: She is alert.  Skin: Skin is warm and dry.  Psychiatric: She has a normal mood and affect.    ED Course  Dental Date/Time: 05/22/2012 6:15 PM Performed by: Dierdre Forth Authorized by: Dierdre Forth Consent: Verbal consent obtained. Risks and benefits: risks, benefits and alternatives were discussed Consent given by:  patient Patient understanding: patient states understanding of the procedure being performed Patient consent: the patient's understanding of the procedure matches consent given Procedure consent: procedure consent matches procedure scheduled Relevant documents: relevant documents present and verified Site marked: the operative site was marked Required items: required blood products, implants, devices, and special equipment available Patient identity confirmed: verbally with patient and arm band Time out: Immediately prior to procedure a "time out" was called to verify the correct patient, procedure, equipment, support staff and site/side marked as required. Preparation: Patient was prepped and draped in the usual sterile fashion. Local anesthesia used: yes Anesthesia: local infiltration Local anesthetic: bupivacaine 0.5% with epinephrine Anesthetic total: 0.75 ml Patient sedated: no Patient tolerance: Patient tolerated the procedure well with no immediate complications. Comments: Dental block of tooth # 20 without complication and with resolution of pain    DIAGNOSTIC STUDIES: Oxygen Saturation is 100% on room air, normal by my interpretation.    COORDINATION OF CARE:  6:04 PM Discussed course of care with pt which includes a dental block and prescribed antibiotic and pain medication.  Advised follow up with dentist.  Pt understands and agrees.   6:08 PM  Dental block performed by Dierdre Forth, PA-C.  Bupivacaine used.  Pt experienced no problems with procedure.    Labs Reviewed - No data to display No results found.   1. Pain, dental   2. Dental caries       MDM  Tonya Gonzales presents for tooth pain.  No gross abscess.  Exam unconcerning for Ludwig's angina or spread of infection.  Will treat with penicillin and pain medicine.  Urged patient to follow-up with dentist.  Pt given dentist referral.  I have also discussed reasons to return immediately to the ER.  Patient  expresses understanding and agrees with plan.   I personally performed the services described in this documentation, which was scribed in my presence. The recorded information has been reviewed and is accurate.   Tonya Client Oza Oberle, PA-C 05/22/12 1830

## 2012-05-23 NOTE — ED Provider Notes (Signed)
Medical screening examination/treatment/procedure(s) were performed by non-physician practitioner and as supervising physician I was immediately available for consultation/collaboration.   Natisha Trzcinski, MD 05/23/12 0028 

## 2012-07-13 ENCOUNTER — Emergency Department (HOSPITAL_COMMUNITY)
Admission: EM | Admit: 2012-07-13 | Discharge: 2012-07-13 | Disposition: A | Discharge status: Home / Self Care | Payer: BC Managed Care – PPO | Attending: Emergency Medicine | Admitting: Emergency Medicine

## 2012-07-13 DIAGNOSIS — N899 Noninflammatory disorder of vagina, unspecified: Secondary | ICD-10-CM | POA: Insufficient documentation

## 2012-07-13 DIAGNOSIS — Z91040 Latex allergy status: Secondary | ICD-10-CM | POA: Insufficient documentation

## 2012-07-13 DIAGNOSIS — Z3202 Encounter for pregnancy test, result negative: Secondary | ICD-10-CM | POA: Insufficient documentation

## 2012-07-13 DIAGNOSIS — N898 Other specified noninflammatory disorders of vagina: Secondary | ICD-10-CM

## 2012-07-13 DIAGNOSIS — J45909 Unspecified asthma, uncomplicated: Secondary | ICD-10-CM | POA: Insufficient documentation

## 2012-07-13 DIAGNOSIS — F17200 Nicotine dependence, unspecified, uncomplicated: Secondary | ICD-10-CM | POA: Insufficient documentation

## 2012-07-13 LAB — URINALYSIS, ROUTINE W REFLEX MICROSCOPIC
Ketones, ur: NEGATIVE mg/dL
Leukocytes, UA: NEGATIVE
Nitrite: NEGATIVE
Protein, ur: NEGATIVE mg/dL

## 2012-07-13 LAB — WET PREP, GENITAL: Clue Cells Wet Prep HPF POC: NONE SEEN

## 2012-07-13 LAB — PREGNANCY, URINE: Preg Test, Ur: NEGATIVE

## 2012-07-13 MED ORDER — TRIAMCINOLONE ACETONIDE 0.1 % EX CREA: TOPICAL_CREAM | Freq: Two times a day (BID) | CUTANEOUS | Status: DC

## 2012-07-13 NOTE — ED Notes (Signed)
Pt states she has burning and itching to her vaginal area. Pt denies vaginal discharge. Pt states she just ended her period yesterday. Pt ambulatory to exam room with steady gait.

## 2012-07-13 NOTE — ED Provider Notes (Signed)
History    This chart was scribed for a non-physician practitioner working with Gerhard Munch, MD by Jiles Prows, ED scribe. This patient was seen in room WTR5/WTR5 and the patient's care was started at 7:27 PM.  CSN: [id number] Arrival date & time 07/13/12  1726   Chief Complaint  Patient presents with  . Vaginal Itching   Patient is a 22 y.o. female presenting with vaginal itching. The history is provided by the patient and medical records. No language interpreter was used.  Vaginal Itching   HPI Comments: Tonya Gonzales is a 22 y.o. female who presents to the Emergency Department complaining of moderate, constant itching to vagina onset today.  Pt reports that her period stopped yesterday, and her vagina began itching today.  Pt reports that she has not had sex since May, and that her previous yeast infections did not feel like the current itchiness.  Pt reports that her period started last Friday.  Pt denies abdominal pain, headache, diaphoresis, fever, chills, nausea, vomiting, diarrhea, weakness, cough, SOB and any other pain.   Past Medical History  Diagnosis Date  . Asthma    No past surgical history on file. No family history on file. History  Substance Use Topics  . Smoking status: Current Every Day Smoker -- 0.50 packs/day  . Smokeless tobacco: Not on file  . Alcohol Use: Yes   OB History   Grav Para Term Preterm Abortions TAB SAB Ect Mult Living                 Review of Systems  All other systems reviewed and are negative.    Allergies  Latex and Morphine and related  Home Medications   Current Outpatient Rx  Name  Route  Sig  Dispense  Refill  . etonogestrel (IMPLANON) 68 MG IMPL implant   Subcutaneous   Inject 1 each into the skin once.         Marland Kitchen ibuprofen (ADVIL,MOTRIN) 200 MG tablet   Oral   Take 800 mg by mouth every 6 (six) hours as needed for pain.         Marland Kitchen triamcinolone cream (KENALOG) 0.1 %   Topical   Apply topically 2 (two) times  daily.   30 g   0    BP 124/71  Pulse 61  Temp(Src) 99.4 F (37.4 C) (Oral)  SpO2 100%  LMP 07/12/2012 Physical Exam  Nursing note and vitals reviewed. Constitutional: She appears well-developed and well-nourished. No distress.  Awake, alert, nontoxic appearance  HENT:  Head: Normocephalic and atraumatic.  Mouth/Throat: Oropharynx is clear and moist. No oropharyngeal exudate.  Eyes: Conjunctivae are normal. No scleral icterus.  Neck: Normal range of motion. Neck supple.  Cardiovascular: Normal rate, regular rhythm and intact distal pulses.   Pulmonary/Chest: Effort normal and breath sounds normal. No respiratory distress. She has no wheezes.  Abdominal: Soft. Bowel sounds are normal. She exhibits no mass. There is no tenderness. There is no rebound and no guarding.  Musculoskeletal: Normal range of motion. She exhibits no edema.  Neurological: She is alert.  Speech is clear and goal oriented Moves extremities without ataxia  Skin: Skin is warm and dry. She is not diaphoretic.  Psychiatric: She has a normal mood and affect.    ED Course  Procedures (including critical care time) DIAGNOSTIC STUDIES: Oxygen Saturation is 100% on RA, normal by my interpretation.    COORDINATION OF CARE: 7:30 PM - Discussed ED treatment with pt at bedside  including pelvic exam and urinalysis and pt agrees.   8:17 PM - Pelvic Exam with Chaperone.  Mucous and blood.  Malodorous.  Concern for pelvic infection.  Labs Reviewed  WET PREP, GENITAL - Abnormal; Notable for the following:    WBC, Wet Prep HPF POC FEW (*)    All other components within normal limits  URINALYSIS, ROUTINE W REFLEX MICROSCOPIC - Abnormal; Notable for the following:    Hgb urine dipstick MODERATE (*)    All other components within normal limits  GC/CHLAMYDIA PROBE AMP  PREGNANCY, URINE  URINE MICROSCOPIC-ADD ON   No results found. 1. Vaginal irritation     MDM  Tonya Gonzales presents for vaginal itching. Wet prep  with only a few white blood cells, no clue cells or yeast. Urinalysis unremarkable, without evidence of urinary tract infection, pregnancy test negative. Patient with contact irritation secondary to maxi pad use.  We'll discharge home with triamcinolone for itching and have her followup with OB/GYN.  I have also discussed reasons to return immediately to the ER.  Patient expresses understanding and agrees with plan.  I personally performed the services described in this documentation, which was scribed in my presence. The recorded information has been reviewed and is accurate.'   Dierdre Forth, PA-C 07/13/12 2117

## 2012-07-13 NOTE — ED Provider Notes (Signed)
  Medical screening examination/treatment/procedure(s) were performed by non-physician practitioner and as supervising physician I was immediately available for consultation/collaboration.    Kaylen Nghiem, MD 07/13/12 2338 

## 2012-07-14 LAB — GC/CHLAMYDIA PROBE AMP: GC Probe RNA: NEGATIVE

## 2012-09-26 ENCOUNTER — Emergency Department (HOSPITAL_COMMUNITY): Payer: BC Managed Care – PPO

## 2012-09-26 ENCOUNTER — Encounter (HOSPITAL_COMMUNITY): Payer: Self-pay

## 2012-09-26 ENCOUNTER — Emergency Department (HOSPITAL_COMMUNITY)
Admission: EM | Admit: 2012-09-26 | Discharge: 2012-09-27 | Disposition: A | Discharge status: Home / Self Care | Payer: BC Managed Care – PPO | Attending: Emergency Medicine | Admitting: Emergency Medicine

## 2012-09-26 DIAGNOSIS — Z711 Person with feared health complaint in whom no diagnosis is made: Secondary | ICD-10-CM

## 2012-09-26 DIAGNOSIS — N730 Acute parametritis and pelvic cellulitis: Secondary | ICD-10-CM | POA: Insufficient documentation

## 2012-09-26 DIAGNOSIS — Z202 Contact with and (suspected) exposure to infections with a predominantly sexual mode of transmission: Secondary | ICD-10-CM | POA: Insufficient documentation

## 2012-09-26 DIAGNOSIS — Z91040 Latex allergy status: Secondary | ICD-10-CM | POA: Insufficient documentation

## 2012-09-26 DIAGNOSIS — N898 Other specified noninflammatory disorders of vagina: Secondary | ICD-10-CM | POA: Insufficient documentation

## 2012-09-26 DIAGNOSIS — H00014 Hordeolum externum left upper eyelid: Secondary | ICD-10-CM

## 2012-09-26 DIAGNOSIS — R110 Nausea: Secondary | ICD-10-CM | POA: Insufficient documentation

## 2012-09-26 DIAGNOSIS — H00019 Hordeolum externum unspecified eye, unspecified eyelid: Secondary | ICD-10-CM | POA: Insufficient documentation

## 2012-09-26 DIAGNOSIS — Z792 Long term (current) use of antibiotics: Secondary | ICD-10-CM | POA: Insufficient documentation

## 2012-09-26 DIAGNOSIS — F17200 Nicotine dependence, unspecified, uncomplicated: Secondary | ICD-10-CM | POA: Insufficient documentation

## 2012-09-26 DIAGNOSIS — Z3202 Encounter for pregnancy test, result negative: Secondary | ICD-10-CM | POA: Insufficient documentation

## 2012-09-26 DIAGNOSIS — J45909 Unspecified asthma, uncomplicated: Secondary | ICD-10-CM | POA: Insufficient documentation

## 2012-09-26 LAB — CBC WITH DIFFERENTIAL/PLATELET
Basophils Absolute: 0 10*3/uL (ref 0.0–0.1)
Basophils Relative: 1 % (ref 0–1)
HCT: 39.4 % (ref 36.0–46.0)
Hemoglobin: 13.7 g/dL (ref 12.0–15.0)
Lymphocytes Relative: 40 % (ref 12–46)
MCHC: 34.8 g/dL (ref 30.0–36.0)
Neutro Abs: 4.2 10*3/uL (ref 1.7–7.7)
Neutrophils Relative %: 51 % (ref 43–77)
RBC: 4.41 MIL/uL (ref 3.87–5.11)
RDW: 12.8 % (ref 11.5–15.5)
WBC: 8.2 10*3/uL (ref 4.0–10.5)

## 2012-09-26 LAB — COMPREHENSIVE METABOLIC PANEL
ALT: 15 U/L (ref 0–35)
AST: 13 U/L (ref 0–37)
Albumin: 3.9 g/dL (ref 3.5–5.2)
Alkaline Phosphatase: 63 U/L (ref 39–117)
CO2: 26 mEq/L (ref 19–32)
Chloride: 106 mEq/L (ref 96–112)
Creatinine, Ser: 0.62 mg/dL (ref 0.50–1.10)
GFR calc non Af Amer: >90 mL/min (ref >90)
Potassium: 3.2 mEq/L — ABNORMAL LOW (ref 3.5–5.1)
Total Bilirubin: 0.5 mg/dL (ref 0.3–1.2)
Total Protein: 7.3 g/dL (ref 6.0–8.3)

## 2012-09-26 LAB — URINE MICROSCOPIC-ADD ON

## 2012-09-26 LAB — URINALYSIS, ROUTINE W REFLEX MICROSCOPIC
Bilirubin Urine: NEGATIVE
Glucose, UA: NEGATIVE mg/dL
Ketones, ur: 15 mg/dL — AB
Protein, ur: NEGATIVE mg/dL
pH: 6.5 (ref 5.0–8.0)

## 2012-09-26 LAB — WET PREP, GENITAL

## 2012-09-26 MED ORDER — ONDANSETRON 4 MG PO TBDP
8.0000 mg | ORAL_TABLET | Freq: Once | ORAL | Status: AC
  Administered 2012-09-26: 8 mg via ORAL
  Filled 2012-09-26: qty 2

## 2012-09-26 MED ORDER — DOXYCYCLINE HYCLATE 100 MG PO CAPS: 100.0000 mg | ORAL_CAPSULE | Freq: Two times a day (BID) | ORAL | Status: DC

## 2012-09-26 MED ORDER — ERYTHROMYCIN 5 MG/GM OP OINT: TOPICAL_OINTMENT | OPHTHALMIC | Status: DC

## 2012-09-26 MED ORDER — METRONIDAZOLE 500 MG PO TABS
2000.0000 mg | ORAL_TABLET | Freq: Once | ORAL | Status: AC
  Administered 2012-09-27: 2000 mg via ORAL
  Filled 2012-09-26: qty 4

## 2012-09-26 MED ORDER — AZITHROMYCIN 250 MG PO TABS
1000.0000 mg | ORAL_TABLET | Freq: Once | ORAL | Status: AC
  Administered 2012-09-27: 1000 mg via ORAL
  Filled 2012-09-26: qty 4

## 2012-09-26 MED ORDER — CEFTRIAXONE SODIUM 250 MG IJ SOLR
250.0000 mg | Freq: Once | INTRAMUSCULAR | Status: AC
  Administered 2012-09-27: 250 mg via INTRAMUSCULAR
  Filled 2012-09-26: qty 250

## 2012-09-26 MED ORDER — FLUCONAZOLE 100 MG PO TABS
200.0000 mg | ORAL_TABLET | Freq: Once | ORAL | Status: AC
Start: 2012-09-26 — End: 2012-09-27
  Administered 2012-09-27: 200 mg via ORAL
  Filled 2012-09-26: qty 2

## 2012-09-26 NOTE — ED Provider Notes (Signed)
CSN: [id number]     Arrival date & time 09/26/12  1902 History   First MD Initiated Contact with Patient 09/26/12 2051     Chief Complaint  Patient presents with  . Abdominal Pain  . Eye Pain   (Consider location/radiation/quality/duration/timing/severity/associated sxs/prior Treatment) Patient is a 22 y.o. female presenting with abdominal pain and eye pain. The history is provided by the patient and medical records. No language interpreter was used.  Abdominal Pain Pain location:  Suprapubic Pain quality: aching   Pain radiates to:  Does not radiate Pain severity:  Mild Onset quality:  Gradual Duration:  24 hours Timing:  Constant Progression:  Waxing and waning Chronicity:  New Context: recent sexual activity   Relieved by:  Nothing Worsened by:  Nothing tried Ineffective treatments:  None tried Associated symptoms: nausea and vaginal discharge   Associated symptoms: no chest pain, no constipation, no cough, no diarrhea, no dysuria, no fatigue, no fever, no hematuria, no shortness of breath and no vomiting   Eye Pain Associated symptoms include abdominal pain (suprapubic) and nausea. Pertinent negatives include no chest pain, coughing, diaphoresis, fatigue, fever, headaches, rash or vomiting.    Tonya Gonzales is a 22 y.o. female  with a hx of asthma presents to the Emergency Department complaining of gradual, persistent, progressively worsening suprapubic pain beginning yesterday associated vaginal discharge onset yesterday afternoon.  Patient reports she had a new sexual partner on Friday and she did not use a condom. She describes her lower abdominal pain is intermittent, suprapubic and sharp in nature increasing in frequency today.  Patient's last bowel movement was this morning and normal.  Patient reports copious amounts of thick and malodorous vaginal discharge. She states irregular menses but is completing her menstrual cycle now.  She does not take birth control.  G2P2.    Nothing makes the abdominal pain better or worse. She has not tried any over-the-counter therapies.    Patient also complains of stye to the left upper eyelid. She reports it began yesterday as well and she is used hot compresses without relief.  Patient denies changes in vision, pain to the globe or discharge from the eye.  Past Medical History  Diagnosis Date  . Asthma    History reviewed. No pertinent past surgical history. History reviewed. No pertinent family history. History  Substance Use Topics  . Smoking status: Current Every Day Smoker -- 0.50 packs/day  . Smokeless tobacco: Not on file  . Alcohol Use: Yes   OB History   Grav Para Term Preterm Abortions TAB SAB Ect Mult Living                 Review of Systems  Constitutional: Negative for fever, diaphoresis, appetite change, fatigue and unexpected weight change.  HENT: Negative for mouth sores and neck stiffness.   Eyes: Positive for pain. Negative for visual disturbance.  Respiratory: Negative for cough, chest tightness, shortness of breath and wheezing.   Cardiovascular: Negative for chest pain.  Gastrointestinal: Positive for nausea and abdominal pain (suprapubic). Negative for vomiting, diarrhea and constipation.  Endocrine: Negative for polydipsia, polyphagia and polyuria.  Genitourinary: Positive for vaginal discharge. Negative for dysuria, urgency, frequency and hematuria.  Musculoskeletal: Negative for back pain.  Skin: Negative for rash.  Allergic/Immunologic: Negative for immunocompromised state.  Neurological: Negative for syncope, light-headedness and headaches.  Hematological: Does not bruise/bleed easily.  Psychiatric/Behavioral: Negative for sleep disturbance. The patient is not nervous/anxious.     Allergies  Latex and Morphine  and related  Home Medications   Current Outpatient Rx  Name  Route  Sig  Dispense  Refill  . etonogestrel (IMPLANON) 68 MG IMPL implant   Subcutaneous   Inject 1 each  into the skin once.         . doxycycline (VIBRAMYCIN) 100 MG capsule   Oral   Take 1 capsule (100 mg total) by mouth 2 (two) times daily.   20 capsule   0   . erythromycin ophthalmic ointment      Place a 1/2 inch ribbon of ointment into the lower eyelid.   1 g   0    BP 119/71  Pulse 68  Temp(Src) 98.5 F (36.9 C) (Oral)  Resp 16  Wt 146 lb 9.6 oz (66.497 kg)  SpO2 99%  LMP 09/03/2012 Physical Exam  Nursing note and vitals reviewed. Constitutional: She is oriented to person, place, and time. She appears well-developed and well-nourished. No distress.  Awake, alert, nontoxic appearance  HENT:  Head: Normocephalic and atraumatic.  Nose: Nose normal.  Mouth/Throat: Uvula is midline, oropharynx is clear and moist and mucous membranes are normal. Mucous membranes are not dry. No edematous. No oropharyngeal exudate, posterior oropharyngeal edema, posterior oropharyngeal erythema or tonsillar abscesses.  Eyes: Conjunctivae and EOM are normal. Pupils are equal, round, and reactive to light. Right eye exhibits no chemosis, no discharge, no exudate and no hordeolum. No foreign body present in the right eye. Left eye exhibits hordeolum. Left eye exhibits no chemosis, no discharge and no exudate. No foreign body present in the left eye. Right conjunctiva is not injected. Right conjunctiva has no hemorrhage. Left conjunctiva is not injected. Left conjunctiva has no hemorrhage. No scleral icterus. Right eye exhibits normal extraocular motion. Left eye exhibits normal extraocular motion. Right pupil is reactive. Left pupil is reactive.  PERRLA Hordeolum to the upper lid of the left eye without purulent drainage, induration of evidence of abscess Extraocular movements intact without pain or diplopia  Neck: Normal range of motion. Neck supple.  Cardiovascular: Normal rate, regular rhythm, normal heart sounds and intact distal pulses.   No murmur heard. Pulmonary/Chest: Effort normal and  breath sounds normal. No respiratory distress. She has no wheezes. She has no rales.  Abdominal: Soft. Bowel sounds are normal. She exhibits no distension and no mass. There is no hepatosplenomegaly. There is tenderness in the suprapubic area. There is no rebound, no guarding and no CVA tenderness. Hernia confirmed negative in the right inguinal area and confirmed negative in the left inguinal area.    Mild suprapubic pain to palpation  Genitourinary: Uterus normal. Pelvic exam was performed with patient supine. There is no rash, tenderness or lesion on the right labia. There is no rash, tenderness or lesion on the left labia. Uterus is not deviated, not enlarged, not fixed and not tender. Cervix exhibits motion tenderness (mild). Cervix exhibits no discharge and no friability. Right adnexum displays tenderness. Right adnexum displays no mass and no fullness. Left adnexum displays no mass, no tenderness and no fullness. No erythema, tenderness or bleeding around the vagina. No foreign body around the vagina. No signs of injury around the vagina. Vaginal discharge (thick, green, malodorous, copious amounts) found.  Mild right adnexal tenderness  Musculoskeletal: Normal range of motion. She exhibits no edema.  Lymphadenopathy:    She has no cervical adenopathy.       Right: No inguinal adenopathy present.       Left: No inguinal adenopathy present.  Neurological:  She is alert and oriented to person, place, and time. She exhibits normal muscle tone. Coordination normal.  Speech is clear and goal oriented Moves extremities without ataxia  Skin: Skin is warm and dry. No rash noted. She is not diaphoretic.  Psychiatric: She has a normal mood and affect. Her behavior is normal.    ED Course  Procedures (including critical care time) Labs Review Labs Reviewed  WET PREP, GENITAL - Abnormal; Notable for the following:    Yeast Wet Prep HPF POC MODERATE (*)    Clue Cells Wet Prep HPF POC FEW (*)     WBC, Wet Prep HPF POC TOO NUMEROUS TO COUNT (*)    All other components within normal limits  COMPREHENSIVE METABOLIC PANEL - Abnormal; Notable for the following:    Potassium 3.2 (*)    All other components within normal limits  URINALYSIS, ROUTINE W REFLEX MICROSCOPIC - Abnormal; Notable for the following:    Hgb urine dipstick SMALL (*)    Ketones, ur 15 (*)    Leukocytes, UA MODERATE (*)    All other components within normal limits  GC/CHLAMYDIA PROBE AMP  CBC WITH DIFFERENTIAL  LIPASE, BLOOD  URINE MICROSCOPIC-ADD ON  RPR  HIV ANTIBODY (ROUTINE TESTING)  POCT PREGNANCY, URINE   Imaging Review US Transvaginal Non-ob  09/26/2012   CLINICAL DATA:  Pelvic pain.  EXAM: TRANSVAGINAL ULTRASOUND OF PELVIS  TECHNIQUE: Transvaginal ultrasound examination of the pelvis was performed including evaluation of the uterus, ovaries, adnexal regions, and pelvic cul-de-sac.  COMPARISON:  None.  FINDINGS: Uterus  Measurements: 8.1 x 3.9 x 4.8 cm. No fibroids or other mass visualized. Sonographer reports cervical motion tenderness with scanning.  Endometrium  Thickness: 0.3 cm.  No focal abnormality visualized.  Right ovary  Measurements: 4.9 x 2.1 x 2.8 cm. Normal appearance/no adnexal mass.  Left ovary  Measurements: 4.4 x 2.0 x 1.9 cm. Normal appearance/no adnexal mass.  Other findings:  A small amount of free pelvic fluid is identified.  IMPRESSION: No focal abnormality is identified. Sonographer reports cervical motion tenderness with scanning. Recommend clinical correlation for pelvic inflammatory disease.   Electronically Signed   By: Drusilla Kanner M.D.   On: 09/26/2012 23:12   US Pelvis Complete  09/26/2012   CLINICAL DATA:  Pelvic pain.  EXAM: TRANSVAGINAL ULTRASOUND OF PELVIS  TECHNIQUE: Transvaginal ultrasound examination of the pelvis was performed including evaluation of the uterus, ovaries, adnexal regions, and pelvic cul-de-sac.  COMPARISON:  None.  FINDINGS: Uterus  Measurements: 8.1 x 3.9  x 4.8 cm. No fibroids or other mass visualized. Sonographer reports cervical motion tenderness with scanning.  Endometrium  Thickness: 0.3 cm.  No focal abnormality visualized.  Right ovary  Measurements: 4.9 x 2.1 x 2.8 cm. Normal appearance/no adnexal mass.  Left ovary  Measurements: 4.4 x 2.0 x 1.9 cm. Normal appearance/no adnexal mass.  Other findings:  A small amount of free pelvic fluid is identified.  IMPRESSION: No focal abnormality is identified. Sonographer reports cervical motion tenderness with scanning. Recommend clinical correlation for pelvic inflammatory disease.   Electronically Signed   By: Drusilla Kanner M.D.   On: 09/26/2012 23:12    MDM   1. Concern about STD in female without diagnosis   2. PID (acute pelvic inflammatory disease)   3. Hordeolum externum of left upper eyelid      Tonya Gonzales presents with new unprotected sexual contact, vaginal discharge, suprapubic pain.  Vaginal exam consistent with STI, likely gonorrhea.  Concern  for PID. Will obtain ultrasound to rule out tubo-ovarian abscess.  Wet prep with moderate yeast and 2 numerous to count white blood cells.  Mild hypokalemia at 3.2. Labs otherwise unremarkable.  Pelvic ultrasound with small amount of free pelvic fluid as well as cervical motion tenderness with scanning but no focal abnormality or abscess.  Discussed importance of using protection when sexually active. Pt understands that they have GC/Chlamydia, HIV and syphilis cultures pending and that they will need to inform all sexual partners if results return positive. Pt has been treated prophylacticly with azithromycin, rocephin and Flagyl due to pts history, pelvic exam, and wet prep with increased WBCs. He should also given Diflucan here due to increased yeast. Patient is hemodynamically stable and is without evidence of tubo-ovarian abscess on ultrasound.  Will treat PID as an outpatient.  Patient has been discharged home with doxycycline and strict  followup instructions with OB/GYN.  Patient also with left eye hordeolum.  No evidence of cellulitis or periorbital cellulitis.  We'll treat with erythromycin ointment and hot compresses.  Followup with ophthalmology if symptoms persist.  It has been determined that no acute conditions requiring further emergency intervention are present at this time. The patient/guardian have been advised of the diagnosis and plan. We have discussed signs and symptoms that warrant return to the ED, such as changes or worsening in symptoms.   Vital signs are stable at discharge.   BP 119/71  Pulse 68  Temp(Src) 98.5 F (36.9 C) (Oral)  Resp 16  Wt 146 lb 9.6 oz (66.497 kg)  SpO2 99%  LMP 09/03/2012  Patient/guardian has voiced understanding and agreed to follow-up with the PCP or specialist.    Dierdre Forth, PA-C 09/27/12 0008

## 2012-09-26 NOTE — ED Notes (Addendum)
Pt c/o generalized abd pain starting yesterday. Pt also reports feeling nauseous and having diarrhea but states "that's normal for me." Pt also reports a sty to her Left eye x1 day. Pt denies burning with urination, does reports abnormal vaginal discharge

## 2012-09-26 NOTE — ED Notes (Signed)
Patient in ultrasound.

## 2012-09-27 LAB — RPR: RPR Ser Ql: NONREACTIVE

## 2012-09-27 LAB — HIV ANTIBODY (ROUTINE TESTING W REFLEX): HIV: NONREACTIVE

## 2012-09-27 LAB — GC/CHLAMYDIA PROBE AMP: CT Probe RNA: NEGATIVE

## 2012-09-27 MED ORDER — LIDOCAINE HCL (PF) 1 % IJ SOLN
INTRAMUSCULAR | Status: AC
  Filled 2012-09-27: qty 5

## 2012-09-27 NOTE — ED Provider Notes (Signed)
Medical screening examination/treatment/procedure(s) were performed by non-physician practitioner and as supervising physician I was immediately available for consultation/collaboration.   Shelda Jakes, MD 09/27/12 1213

## 2012-11-03 ENCOUNTER — Encounter (HOSPITAL_COMMUNITY): Payer: Self-pay | Admitting: Emergency Medicine

## 2012-11-03 ENCOUNTER — Emergency Department (HOSPITAL_COMMUNITY): Payer: BC Managed Care – PPO

## 2012-11-03 ENCOUNTER — Emergency Department (HOSPITAL_COMMUNITY)
Admission: EM | Admit: 2012-11-03 | Discharge: 2012-11-03 | Disposition: A | Discharge status: Home / Self Care | Payer: BC Managed Care – PPO | Attending: Emergency Medicine | Admitting: Emergency Medicine

## 2012-11-03 DIAGNOSIS — J45909 Unspecified asthma, uncomplicated: Secondary | ICD-10-CM | POA: Insufficient documentation

## 2012-11-03 DIAGNOSIS — Z91040 Latex allergy status: Secondary | ICD-10-CM | POA: Insufficient documentation

## 2012-11-03 DIAGNOSIS — R1909 Other intra-abdominal and pelvic swelling, mass and lump: Secondary | ICD-10-CM | POA: Insufficient documentation

## 2012-11-03 DIAGNOSIS — F17200 Nicotine dependence, unspecified, uncomplicated: Secondary | ICD-10-CM | POA: Insufficient documentation

## 2012-11-03 DIAGNOSIS — N730 Acute parametritis and pelvic cellulitis: Secondary | ICD-10-CM | POA: Insufficient documentation

## 2012-11-03 DIAGNOSIS — R000 Tachycardia, unspecified: Secondary | ICD-10-CM | POA: Insufficient documentation

## 2012-11-03 LAB — BASIC METABOLIC PANEL
BUN: 7 mg/dL (ref 6–23)
CO2: 24 mEq/L (ref 19–32)
Calcium: 9.3 mg/dL (ref 8.4–10.5)
Chloride: 99 mEq/L (ref 96–112)
GFR calc non Af Amer: >90 mL/min (ref >90)
Glucose, Bld: 93 mg/dL (ref 70–99)

## 2012-11-03 LAB — CBC WITH DIFFERENTIAL/PLATELET
Eosinophils Absolute: 0.2 10*3/uL (ref 0.0–0.7)
Eosinophils Relative: 3 % (ref 0–5)
HCT: 42.6 % (ref 36.0–46.0)
Lymphs Abs: 1.1 10*3/uL (ref 0.7–4.0)
MCH: 31.9 pg (ref 26.0–34.0)
MCV: 90.6 fL (ref 78.0–100.0)
Monocytes Relative: 12 % (ref 3–12)
Platelets: 174 10*3/uL (ref 150–400)
RBC: 4.7 MIL/uL (ref 3.87–5.11)
WBC: 7.5 10*3/uL (ref 4.0–10.5)

## 2012-11-03 LAB — WET PREP, GENITAL
Trich, Wet Prep: NONE SEEN
Yeast Wet Prep HPF POC: NONE SEEN

## 2012-11-03 MED ORDER — CEFTRIAXONE SODIUM 250 MG IJ SOLR
250.0000 mg | Freq: Once | INTRAMUSCULAR | Status: AC
  Administered 2012-11-03: 250 mg via INTRAMUSCULAR
  Filled 2012-11-03: qty 250

## 2012-11-03 MED ORDER — HYDROCODONE-ACETAMINOPHEN 5-325 MG PO TABS: 2.0000 | ORAL_TABLET | Freq: Four times a day (QID) | ORAL | Status: DC | PRN

## 2012-11-03 MED ORDER — ONDANSETRON 4 MG PO TBDP
4.0000 mg | ORAL_TABLET | Freq: Once | ORAL | Status: AC
  Administered 2012-11-03: 4 mg via ORAL
  Filled 2012-11-03: qty 1

## 2012-11-03 MED ORDER — DOXYCYCLINE HYCLATE 100 MG PO CAPS: 100.0000 mg | ORAL_CAPSULE | Freq: Two times a day (BID) | ORAL | Status: DC

## 2012-11-03 MED ORDER — OXYCODONE-ACETAMINOPHEN 5-325 MG PO TABS
2.0000 | ORAL_TABLET | Freq: Once | ORAL | Status: AC
  Administered 2012-11-03: 2 via ORAL
  Filled 2012-11-03: qty 2

## 2012-11-03 MED ORDER — HYDROMORPHONE HCL PF 1 MG/ML IJ SOLN: 0.5000 mg | Freq: Once | INTRAMUSCULAR | Status: DC

## 2012-11-03 MED ORDER — METRONIDAZOLE 500 MG PO TABS: 500.0000 mg | ORAL_TABLET | Freq: Two times a day (BID) | ORAL | Status: DC

## 2012-11-03 MED ORDER — LIDOCAINE HCL (PF) 1 % IJ SOLN
INTRAMUSCULAR | Status: AC
  Administered 2012-11-03: 2 mL
  Filled 2012-11-03: qty 5

## 2012-11-03 NOTE — ED Notes (Signed)
Woke up this morning and underwear were soaked with clear yellow discharge and now has lumps in vaginal and inguinal area and complains of nausea.

## 2012-11-03 NOTE — ED Provider Notes (Signed)
CSN: [id number]     Arrival date & time 11/03/12  1314 History   First MD Initiated Contact with Patient 11/03/12 1338     Chief Complaint  Patient presents with  . Vaginal Bleeding  . Groin Swelling   (Consider location/radiation/quality/duration/timing/severity/associated sxs/prior Treatment) HPI Comments: Patient presents emergency department with chief complaint of pelvic pain, and vaginal discharge. She states the pain began this morning. She states that she has had pain like this before, but is much worse. She states that she had a cyst on her ovary previously. She endorses subjective fevers and chills, and nausea. She denies vomiting, diarrhea, or constipation. She denies any new sexual partners. States the pain is moderate to severe. She denies any other complaints.  Patient was recently diagnosed with PID, but never filled the prescriptions.  The history is provided by the patient. No language interpreter was used.    Past Medical History  Diagnosis Date  . Asthma    History reviewed. No pertinent past surgical history. History reviewed. No pertinent family history. History  Substance Use Topics  . Smoking status: Current Every Day Smoker -- 0.50 packs/day  . Smokeless tobacco: Not on file  . Alcohol Use: Yes   OB History   Grav Para Term Preterm Abortions TAB SAB Ect Mult Living                 Review of Systems  All other systems reviewed and are negative.    Allergies  Ibuprofen; Latex; and Morphine and related  Home Medications   Current Outpatient Rx  Name  Route  Sig  Dispense  Refill  . etonogestrel (IMPLANON) 68 MG IMPL implant   Subcutaneous   Inject 1 each into the skin once.          BP 130/74  Pulse 103  Temp(Src) 99.5 F (37.5 C) (Oral)  Resp 16  Ht 5\' 3"  (1.6 m)  Wt 147 lb 5 oz (66.821 kg)  BMI 26.1 kg/m2  SpO2 99%  LMP 10/01/2012 Physical Exam  Nursing note and vitals reviewed. Constitutional: She is oriented to person, place, and  time. She appears well-developed and well-nourished.  HENT:  Head: Normocephalic and atraumatic.  Eyes: Conjunctivae and EOM are normal. Pupils are equal, round, and reactive to light.  Neck: Normal range of motion. Neck supple.  Cardiovascular: Regular rhythm.  Exam reveals no gallop and no friction rub.   No murmur heard. Mildly tachycardic  Pulmonary/Chest: Effort normal and breath sounds normal. No respiratory distress. She has no wheezes. She has no rales. She exhibits no tenderness.  Abdominal: Soft. Bowel sounds are normal. She exhibits no distension and no mass. There is no tenderness. There is no rebound and no guarding. Hernia confirmed negative in the right inguinal area and confirmed negative in the left inguinal area.  Genitourinary: No labial fusion. There is no rash, tenderness, lesion or injury on the right labia. There is no rash, tenderness, lesion or injury on the left labia. Uterus is not deviated, not enlarged, not fixed and not tender. Cervix exhibits motion tenderness and discharge. Cervix exhibits no friability. Right adnexum displays no mass, no tenderness and no fullness. Left adnexum displays tenderness. Left adnexum displays no mass and no fullness. There is tenderness around the vagina. No erythema or bleeding around the vagina. No foreign body around the vagina. No signs of injury around the vagina. Vaginal discharge found.  Chaperone present during exam  Copious discharge, CMT, no signs of abscess, no  warts, or herpes  Musculoskeletal: Normal range of motion. She exhibits no edema and no tenderness.  Lymphadenopathy:       Right: No inguinal adenopathy present.       Left: No inguinal adenopathy present.  Neurological: She is alert and oriented to person, place, and time.  Skin: Skin is warm and dry.  Psychiatric: She has a normal mood and affect. Her behavior is normal. Judgment and thought content normal.    ED Course  Procedures (including critical care  time) Labs Review Labs Reviewed  WET PREP, GENITAL  GC/CHLAMYDIA PROBE AMP   Results for orders placed during the hospital encounter of 11/03/12  WET PREP, GENITAL      Result Value Range   Yeast Wet Prep HPF POC NONE SEEN  NONE SEEN   Trich, Wet Prep NONE SEEN  NONE SEEN   Clue Cells Wet Prep HPF POC NONE SEEN  NONE SEEN   WBC, Wet Prep HPF POC MANY (*) NONE SEEN  CBC WITH DIFFERENTIAL      Result Value Range   WBC 7.5  4.0 - 10.5 K/uL   RBC 4.70  3.87 - 5.11 MIL/uL   Hemoglobin 15.0  12.0 - 15.0 g/dL   HCT 30.8  65.7 - 84.6 %   MCV 90.6  78.0 - 100.0 fL   MCH 31.9  26.0 - 34.0 pg   MCHC 35.2  30.0 - 36.0 g/dL   RDW 96.2  95.2 - 84.1 %   Platelets 174  150 - 400 K/uL   Neutrophils Relative % 70  43 - 77 %   Neutro Abs 5.2  1.7 - 7.7 K/uL   Lymphocytes Relative 15  12 - 46 %   Lymphs Abs 1.1  0.7 - 4.0 K/uL   Monocytes Relative 12  3 - 12 %   Monocytes Absolute 0.9  0.1 - 1.0 K/uL   Eosinophils Relative 3  0 - 5 %   Eosinophils Absolute 0.2  0.0 - 0.7 K/uL   Basophils Relative 0  0 - 1 %   Basophils Absolute 0.0  0.0 - 0.1 K/uL  BASIC METABOLIC PANEL      Result Value Range   Sodium 135  135 - 145 mEq/L   Potassium 3.6  3.5 - 5.1 mEq/L   Chloride 99  96 - 112 mEq/L   CO2 24  19 - 32 mEq/L   Glucose, Bld 93  70 - 99 mg/dL   BUN 7  6 - 23 mg/dL   Creatinine, Ser 3.24  0.50 - 1.10 mg/dL   Calcium 9.3  8.4 - 40.1 mg/dL   GFR calc non Af Amer >90  >90 mL/min   GFR calc Af Amer >90  >90 mL/min   US Transvaginal Non-ob  11/03/2012   CLINICAL DATA:  Vaginal pain and discharge  EXAM: TRANSABDOMINAL AND TRANSVAGINAL ULTRASOUND OF PELVIS  TECHNIQUE: Both transabdominal and transvaginal ultrasound examinations of the pelvis were performed. Transabdominal technique was performed for global imaging of the pelvis including uterus, ovaries, adnexal regions, and pelvic cul-de-sac. It was necessary to proceed with endovaginal exam following the transabdominal exam to visualize the  endometrium and ovaries.  COMPARISON:  09/26/2012 similar exam  FINDINGS: Uterus  Measurements: 8.1 x 4.0 x 3.4 cm. Anteverted, mildly retroflexed. This renders visualization of the fundus mildly suboptimal. No fibroids or other mass visualized.  Endometrium  Thickness: 5 mm. Uniformly echogenic without focal abnormality.  Right ovary  Measurements: 4.1 x 2.9 x  2.2 cm. Normal appearance/no adnexal mass.  Left ovary  Measurements: 3.8 x 2.4 x 2.0 cm. Normal appearance/no adnexal mass.  Other findings  No free fluid.  IMPRESSION: Normal exam.   Electronically Signed   By: Christiana Pellant M.D.   On: 11/03/2012 15:52   US Pelvis Complete  11/03/2012   CLINICAL DATA:  Vaginal pain and discharge  EXAM: TRANSABDOMINAL AND TRANSVAGINAL ULTRASOUND OF PELVIS  TECHNIQUE: Both transabdominal and transvaginal ultrasound examinations of the pelvis were performed. Transabdominal technique was performed for global imaging of the pelvis including uterus, ovaries, adnexal regions, and pelvic cul-de-sac. It was necessary to proceed with endovaginal exam following the transabdominal exam to visualize the endometrium and ovaries.  COMPARISON:  09/26/2012 similar exam  FINDINGS: Uterus  Measurements: 8.1 x 4.0 x 3.4 cm. Anteverted, mildly retroflexed. This renders visualization of the fundus mildly suboptimal. No fibroids or other mass visualized.  Endometrium  Thickness: 5 mm. Uniformly echogenic without focal abnormality.  Right ovary  Measurements: 4.1 x 2.9 x 2.2 cm. Normal appearance/no adnexal mass.  Left ovary  Measurements: 3.8 x 2.4 x 2.0 cm. Normal appearance/no adnexal mass.  Other findings  No free fluid.  IMPRESSION: Normal exam.   Electronically Signed   By: Christiana Pellant M.D.   On: 11/03/2012 15:52      EKG Interpretation   None       MDM   1. PID (acute pelvic inflammatory disease)      Patient with vaginal discharge and pelvic pain.  Pain started this morning.  CMT on pelvic with copious discharge.   Will order pelvic US.  R/o torsion/TOA.  Suspect that the patient has PID.  Will check labs and give pain meds.  Patient discussed with Dr. Criss Alvine, who agrees with the plan.    Follow-up on pelvic US.  Dispo pending [place] results.  Suspicion for PID based on discharge and CMT.  [place] is negative.  Will treat for PID.  Recommend GYN follow-up.  Patient understands and agrees with the plan.   Roxy Horseman, PA-C 11/03/12 1601  Roxy Horseman, PA-C 11/03/12 1603  Roxy Horseman, PA-C 11/03/12 [phone]

## 2012-11-03 NOTE — ED Notes (Signed)
Patient transported to Ultrasound 

## 2012-11-03 NOTE — ED Notes (Signed)
MD at bedside. 

## 2012-11-03 NOTE — ED Provider Notes (Signed)
Medical screening examination/treatment/procedure(s) were performed by non-physician practitioner and as supervising physician I was immediately available for consultation/collaboration.  EKG Interpretation   None         Audree Camel, MD 11/03/12 1627

## 2012-12-03 ENCOUNTER — Ambulatory Visit (INDEPENDENT_AMBULATORY_CARE_PROVIDER_SITE_OTHER): Payer: Self-pay | Admitting: Medical

## 2012-12-03 ENCOUNTER — Encounter: Payer: Self-pay | Admitting: Medical

## 2012-12-03 VITALS — BP 128/83 | HR 79 | Temp 98.2°F | Ht 63.0 in | Wt 146.9 lb

## 2012-12-03 DIAGNOSIS — N949 Unspecified condition associated with female genital organs and menstrual cycle: Secondary | ICD-10-CM

## 2012-12-03 DIAGNOSIS — N898 Other specified noninflammatory disorders of vagina: Secondary | ICD-10-CM

## 2012-12-03 DIAGNOSIS — B373 Candidiasis of vulva and vagina: Secondary | ICD-10-CM

## 2012-12-03 DIAGNOSIS — R102 Pelvic and perineal pain: Secondary | ICD-10-CM | POA: Insufficient documentation

## 2012-12-03 LAB — POCT URINALYSIS DIP (DEVICE)
Bilirubin Urine: NEGATIVE
Glucose, UA: NEGATIVE mg/dL
Nitrite: NEGATIVE
Protein, ur: NEGATIVE mg/dL
Specific Gravity, Urine: 1.02 (ref 1.005–1.030)
pH: 7.5 (ref 5.0–8.0)

## 2012-12-03 MED ORDER — FLUCONAZOLE 150 MG PO TABS: 150.0000 mg | ORAL_TABLET | Freq: Once | ORAL | Status: DC

## 2012-12-03 NOTE — Progress Notes (Signed)
HPI: Tonya Gonzales is a 22 year old female here for white vaginal discharge. She has a recent ER visit in October of this year and was treated for PID. Cultures from the ER are negative. Soon after finishing the 14 day course of Flagyl and doxycycline, she started having the white discharge. She also has mild low back pain and groin pain but does not associate them with this discharge. She has also noted some mild vaginal irritation recently without itching.   Social History:  Sexually active with two partners. Uses latex free "[employer]" condoms (not animal skin). Patient has Implanon in place since 2010.   Review of Systems Denies dysuria, frequency, or urgency. Also denies any constipation or bowel issues.   Physical Exam   BP 128/83  Pulse 79  Temp(Src) 98.2 F (36.8 C)  Ht 5\' 3"  (1.6 m)  Wt 146 lb 14.4 oz (66.633 kg)  BMI 26.03 kg/m2  LMP 10/01/2012 General:   alert, cooperative and no distress  Heart: regular rate and rhythm, S1, S2 normal, no murmur, click, rub or gallop  Lungs: clear to auscultation bilaterally  Abdominal: Soft, non tender. Bowel sounds present in all quadrants. No guarding or rigidity.  Pelvic:  Chaperone Joseph Berkshire, PA-C)   Vulva: normal   Vagina:  Thick, white, curdlike discharge with mild odor   Cervix: no cervical motion tenderness and no lesions   Uterus: normal size, non-tender, normal shape and consistency   Adnexa: Mild left adnexal tenderness. No right adnexal tenderness.   MDM UA, GC Chlamydia cultures, and wet prep results pending.  Assessment:  Yeast vulvovaginitis, clinical  Plan:  Tonya Gonzales is a 23 year old female who recently underwent a 14 day course treatment for PID. Given her physical exam findings and pertinent history, she likely has vaginal candidiasis. Will confirm with wet prep and treat with Diflucan. Rx sent to patient's pharmacy with R1  She was advised to schedule an appointment at a later time for an annual visit including  pap smear, breast exam, etc. She was also educated on the waning/unreliable effectiveness of her Implanon inserted in 2010.   I have seen and evaluated the patient with the PA student. I agree with the assessment and plan as written above.   Freddi Starr, PA-C 12/03/2012 2:55 PM

## 2012-12-03 NOTE — Patient Instructions (Signed)
Monilial Vaginitis  Vaginitis in a soreness, swelling and redness (inflammation) of the vagina and vulva. Monilial vaginitis is not a sexually transmitted infection.  CAUSES   Yeast vaginitis is caused by yeast (candida) that is normally found in your vagina. With a yeast infection, the candida has overgrown in number to a point that upsets the chemical balance.  SYMPTOMS   · White, thick vaginal discharge.  · Swelling, itching, redness and irritation of the vagina and possibly the lips of the vagina (vulva).  · Burning or painful urination.  · Painful intercourse.  DIAGNOSIS   Things that may contribute to monilial vaginitis are:  · Postmenopausal and virginal states.  · Pregnancy.  · Infections.  · Being tired, sick or stressed, especially if you had monilial vaginitis in the past.  · Diabetes. Good control will help lower the chance.  · Birth control pills.  · Tight fitting garments.  · Using bubble bath, feminine sprays, douches or deodorant tampons.  · Taking certain medications that kill germs (antibiotics).  · Sporadic recurrence can occur if you become ill.  TREATMENT   Your caregiver will give you medication.  · There are several kinds of anti monilial vaginal creams and suppositories specific for monilial vaginitis. For recurrent yeast infections, use a suppository or cream in the vagina 2 times a week, or as directed.  · Anti-monilial or steroid cream for the itching or irritation of the vulva may also be used. Get your caregiver's permission.  · Painting the vagina with methylene blue solution may help if the monilial cream does not work.  · Eating yogurt may help prevent monilial vaginitis.  HOME CARE INSTRUCTIONS   · Finish all medication as prescribed.  · Do not have sex until treatment is completed or after your caregiver tells you it is okay.  · Take warm sitz baths.  · Do not douche.  · Do not use tampons, especially scented ones.  · Wear cotton underwear.  · Avoid tight pants and panty  hose.  · Tell your sexual partner that you have a yeast infection. They should go to their caregiver if they have symptoms such as mild rash or itching.  · Your sexual partner should be treated as well if your infection is difficult to eliminate.  · Practice safer sex. Use condoms.  · Some vaginal medications cause latex condoms to fail. Vaginal medications that harm condoms are:  · Cleocin cream.  · Butoconazole (Femstat®).  · Terconazole (Terazol®) vaginal suppository.  · Miconazole (Monistat®) (may be purchased over the counter).  SEEK MEDICAL CARE IF:   · You have a temperature by mouth above 102° F (38.9° C).  · The infection is getting worse after 2 days of treatment.  · The infection is not getting better after 3 days of treatment.  · You develop blisters in or around your vagina.  · You develop vaginal bleeding, and it is not your menstrual period.  · You have pain when you urinate.  · You develop intestinal problems.  · You have pain with sexual intercourse.  Document Released: 09/29/2004 Document Revised: 03/14/2011 Document Reviewed: 06/13/2008  Exit[employer]® Patient Information ©2014 [employer].

## 2012-12-04 LAB — GC/CHLAMYDIA PROBE AMP
CT Probe RNA: NEGATIVE
GC Probe RNA: NEGATIVE

## 2012-12-04 LAB — WET PREP, GENITAL: Trich, Wet Prep: NONE SEEN

## 2013-01-31 ENCOUNTER — Encounter (HOSPITAL_COMMUNITY): Payer: Self-pay | Admitting: Emergency Medicine

## 2013-01-31 ENCOUNTER — Emergency Department (HOSPITAL_COMMUNITY)
Admission: EM | Admit: 2013-01-31 | Discharge: 2013-01-31 | Disposition: A | Discharge status: Home / Self Care | Payer: Self-pay | Attending: Emergency Medicine | Admitting: Emergency Medicine

## 2013-01-31 DIAGNOSIS — Z202 Contact with and (suspected) exposure to infections with a predominantly sexual mode of transmission: Secondary | ICD-10-CM | POA: Insufficient documentation

## 2013-01-31 DIAGNOSIS — N949 Unspecified condition associated with female genital organs and menstrual cycle: Secondary | ICD-10-CM | POA: Insufficient documentation

## 2013-01-31 DIAGNOSIS — R102 Pelvic and perineal pain: Secondary | ICD-10-CM

## 2013-01-31 DIAGNOSIS — F17200 Nicotine dependence, unspecified, uncomplicated: Secondary | ICD-10-CM | POA: Insufficient documentation

## 2013-01-31 DIAGNOSIS — J45909 Unspecified asthma, uncomplicated: Secondary | ICD-10-CM | POA: Insufficient documentation

## 2013-01-31 DIAGNOSIS — N898 Other specified noninflammatory disorders of vagina: Secondary | ICD-10-CM | POA: Insufficient documentation

## 2013-01-31 DIAGNOSIS — L299 Pruritus, unspecified: Secondary | ICD-10-CM | POA: Insufficient documentation

## 2013-01-31 DIAGNOSIS — K029 Dental caries, unspecified: Secondary | ICD-10-CM | POA: Insufficient documentation

## 2013-01-31 DIAGNOSIS — Z3202 Encounter for pregnancy test, result negative: Secondary | ICD-10-CM | POA: Insufficient documentation

## 2013-01-31 DIAGNOSIS — Z91040 Latex allergy status: Secondary | ICD-10-CM | POA: Insufficient documentation

## 2013-01-31 LAB — URINE MICROSCOPIC-ADD ON

## 2013-01-31 LAB — WET PREP, GENITAL
CLUE CELLS WET PREP: NONE SEEN
TRICH WET PREP: NONE SEEN
YEAST WET PREP: NONE SEEN

## 2013-01-31 LAB — URINALYSIS, ROUTINE W REFLEX MICROSCOPIC
BILIRUBIN URINE: NEGATIVE
Glucose, UA: NEGATIVE mg/dL
KETONES UR: NEGATIVE mg/dL
LEUKOCYTES UA: NEGATIVE
NITRITE: NEGATIVE
PROTEIN: NEGATIVE mg/dL
Specific Gravity, Urine: 1.025 (ref 1.005–1.030)
UROBILINOGEN UA: 0.2 mg/dL (ref 0.0–1.0)
pH: 6 (ref 5.0–8.0)

## 2013-01-31 LAB — PREGNANCY, URINE: Preg Test, Ur: NEGATIVE

## 2013-01-31 MED ORDER — PENICILLIN V POTASSIUM 500 MG PO TABS: 500.0000 mg | ORAL_TABLET | Freq: Three times a day (TID) | ORAL | Status: DC

## 2013-01-31 MED ORDER — HYDROCODONE-ACETAMINOPHEN 5-325 MG PO TABS: 1.0000 | ORAL_TABLET | ORAL | Status: DC | PRN

## 2013-01-31 NOTE — Discharge Planning (Signed)
P4 CC Tivis RingerFelicia E, [employer] Liaison  Spoke to patient about primary care resources. Resources and orange card application given, patient was instructed to call for an appointment once she has completed the application.

## 2013-01-31 NOTE — ED Notes (Addendum)
Pelvic cart place at bedside.  PA notified.

## 2013-01-31 NOTE — ED Notes (Signed)
Pt c/o of dental pain on the lower left jaw x 1 week.  Pt states she has pain and tenderness at the location of the abscessed tooth.  Pt also wants to be screened for a STD from an exposure that occurred prior to Christmas.  Pt reports having vaginal discharge, used Monistat, but has swollen Right labia reported by patient.  Pt states she has burning and pain during urination.

## 2013-01-31 NOTE — ED Notes (Signed)
Pt verbalized understanding of discharge instructions.

## 2013-01-31 NOTE — ED Provider Notes (Signed)
CSN: [id number]     Arrival date & time 01/31/13  0857 History   First MD Initiated Contact with Patient 01/31/13 1007     Chief Complaint  Patient presents with  . Dental Pain  . Exposure to STD   (Consider location/radiation/quality/duration/timing/severity/associated sxs/prior Treatment) Patient is a 23 y.o. female presenting with tooth pain and vaginal itching. The history is provided by the patient. No language interpreter was used.  Dental Pain Location:  Lower Quality:  Localized and aching Duration:  1 week Associated symptoms: no fever   Associated symptoms comment:  Lower left dental pain coming on over the last week. No fever or facial swelling. Vaginal Itching Pertinent negatives include no chills or fever. Associated symptoms comments: External vaginal and vulvar pain with white discharge for the past several days. She has tried [employer] without relief. No abnormal bleeding, vaginal blisters or sores..    Past Medical History  Diagnosis Date  . Asthma    History reviewed. No pertinent past surgical history. History reviewed. No pertinent family history. History  Substance Use Topics  . Smoking status: Current Every Day Smoker -- 0.50 packs/day  . Smokeless tobacco: Not on file  . Alcohol Use: Yes   OB History   Grav Para Term Preterm Abortions TAB SAB Ect Mult Living                 Review of Systems  Constitutional: Negative for fever and chills.  Gastrointestinal: Negative.   Genitourinary: Positive for vaginal discharge and vaginal pain. Negative for dysuria, flank pain, vaginal bleeding and pelvic pain.  Musculoskeletal: Negative.   Skin: Negative.   Neurological: Negative.     Allergies  Ibuprofen; Latex; and Morphine and related  Home Medications   Current Outpatient Rx  Name  Route  Sig  Dispense  Refill  . etonogestrel (IMPLANON) 68 MG IMPL implant   Subcutaneous   Inject 1 each into the skin once.          BP 118/73  Pulse 80   Temp(Src) 98.7 F (37.1 C) (Oral)  Resp 16  Ht 5\' 3"  (1.6 m)  Wt 145 lb (65.772 kg)  BMI 25.69 kg/m2  SpO2 100%  LMP 01/20/2013 Physical Exam  Constitutional: She is oriented to person, place, and time. She appears well-developed and well-nourished.  HENT:  Head: Normocephalic.  Neck: Normal range of motion. Neck supple.  Pulmonary/Chest: Effort normal.  Genitourinary: Vagina normal and uterus normal. No vaginal discharge found.  No adnexal mass or tenderness. No CMT. There is tenderness to vulva and vaginal opening. No blisters, redness, ulcerations or swelling.   Musculoskeletal: Normal range of motion.  Neurological: She is alert and oriented to person, place, and time.  Skin: Skin is warm and dry. No rash noted.  Psychiatric: She has a normal mood and affect.    ED Course  Procedures (including critical care time) Labs Review Labs Reviewed  URINALYSIS, ROUTINE W REFLEX MICROSCOPIC - Abnormal; Notable for the following:    Hgb urine dipstick SMALL (*)    All other components within normal limits  WET PREP, GENITAL  GC/CHLAMYDIA PROBE AMP  PREGNANCY, URINE  URINE MICROSCOPIC-ADD ON   Imaging Review No results found.  EKG Interpretation   None       MDM  No diagnosis found. 1. Dental caries 2. Vaginal pain  No vaginal lesions, blisters, swelling or significant cervical discharge. No source of vaginal pain. Refer to GYN for further evaluation. Dental pain treated with  abx, pain medication.    Arnoldo HookerShari A Lynn Recendiz, PA-C 02/01/13 1600

## 2013-01-31 NOTE — Discharge Instructions (Signed)
FOLLOW UP WITH YOUR CHOICE OF GYNECOLOGIST FOR FURTHER EVALUATION OF VAGINAL PAIN. TAKE MEDICATIONS AS PRESCRIBED FOR DENTAL PAIN AND DECAY.

## 2013-02-01 LAB — GC/CHLAMYDIA PROBE AMP
CT PROBE, AMP APTIMA: NEGATIVE
GC PROBE AMP APTIMA: NEGATIVE

## 2013-02-02 NOTE — ED Provider Notes (Signed)
Medical screening examination/treatment/procedure(s) were performed by non-physician practitioner and as supervising physician I was immediately available for consultation/collaboration.  EKG Interpretation   None         Charles B. Bernette MayersSheldon, MD 02/02/13 [phone]

## 2013-03-28 ENCOUNTER — Emergency Department (HOSPITAL_COMMUNITY)
Admission: EM | Admit: 2013-03-28 | Discharge: 2013-03-28 | Disposition: A | Discharge status: Home / Self Care | Payer: BC Managed Care – PPO | Attending: Emergency Medicine | Admitting: Emergency Medicine

## 2013-03-28 ENCOUNTER — Encounter (HOSPITAL_COMMUNITY): Payer: Self-pay | Admitting: Emergency Medicine

## 2013-03-28 DIAGNOSIS — J45909 Unspecified asthma, uncomplicated: Secondary | ICD-10-CM | POA: Insufficient documentation

## 2013-03-28 DIAGNOSIS — M549 Dorsalgia, unspecified: Secondary | ICD-10-CM | POA: Insufficient documentation

## 2013-03-28 DIAGNOSIS — N898 Other specified noninflammatory disorders of vagina: Secondary | ICD-10-CM

## 2013-03-28 DIAGNOSIS — B9689 Other specified bacterial agents as the cause of diseases classified elsewhere: Secondary | ICD-10-CM | POA: Insufficient documentation

## 2013-03-28 DIAGNOSIS — Z91040 Latex allergy status: Secondary | ICD-10-CM | POA: Insufficient documentation

## 2013-03-28 DIAGNOSIS — Z3202 Encounter for pregnancy test, result negative: Secondary | ICD-10-CM | POA: Insufficient documentation

## 2013-03-28 DIAGNOSIS — N760 Acute vaginitis: Secondary | ICD-10-CM

## 2013-03-28 DIAGNOSIS — Z79899 Other long term (current) drug therapy: Secondary | ICD-10-CM | POA: Insufficient documentation

## 2013-03-28 DIAGNOSIS — A499 Bacterial infection, unspecified: Secondary | ICD-10-CM | POA: Insufficient documentation

## 2013-03-28 DIAGNOSIS — F17200 Nicotine dependence, unspecified, uncomplicated: Secondary | ICD-10-CM | POA: Insufficient documentation

## 2013-03-28 LAB — RPR: RPR Ser Ql: NONREACTIVE

## 2013-03-28 LAB — URINALYSIS, ROUTINE W REFLEX MICROSCOPIC
Bilirubin Urine: NEGATIVE
Glucose, UA: NEGATIVE mg/dL
KETONES UR: NEGATIVE mg/dL
Leukocytes, UA: NEGATIVE
NITRITE: NEGATIVE
Protein, ur: NEGATIVE mg/dL
Specific Gravity, Urine: 1.01 (ref 1.005–1.030)
UROBILINOGEN UA: 0.2 mg/dL (ref 0.0–1.0)
pH: 7 (ref 5.0–8.0)

## 2013-03-28 LAB — URINE MICROSCOPIC-ADD ON

## 2013-03-28 LAB — WET PREP, GENITAL
TRICH WET PREP: NONE SEEN
YEAST WET PREP: NONE SEEN

## 2013-03-28 LAB — POC URINE PREG, ED: Preg Test, Ur: NEGATIVE

## 2013-03-28 LAB — HIV ANTIBODY (ROUTINE TESTING W REFLEX): HIV: NONREACTIVE

## 2013-03-28 MED ORDER — METRONIDAZOLE 500 MG PO TABS: 500.0000 mg | ORAL_TABLET | Freq: Two times a day (BID) | ORAL | Status: DC

## 2013-03-28 NOTE — ED Provider Notes (Signed)
CSN: [id number]     Arrival date & time 03/28/13  1631 History   First MD Initiated Contact with Patient 03/28/13 1737     Chief Complaint  Patient presents with  . Vaginal Discharge    HPI 23 year old female presents complaining of vaginal discharge. She was also concerned that she was pregnant. Her last menstrual period was early February.  She's taken 2 home pregnancy tests, both of which have been negative, but she was still suspicious she might be pregnant. Her vaginal discharge is mild to moderate, white, and she says it has a "yeasty" smell. She has had pelvic inflammatory disease in the past. She's not having any fever, abdominal pain, nausea, vomiting, dysuria, urinary urgency or frequency. She's having mild low back pain.  She's previously healthy otherwise and not on any medications chronically. She does have Implanon implant for birth control. Her urine pregnancy test is negative.   Past Medical History  Diagnosis Date  . Asthma    History reviewed. No pertinent past surgical history. No family history on file. History  Substance Use Topics  . Smoking status: Current Every Day Smoker -- 0.25 packs/day    Types: Cigarettes  . Smokeless tobacco: Not on file  . Alcohol Use: Yes     Comment: on weekends   OB History   Grav Para Term Preterm Abortions TAB SAB Ect Mult Living                 Review of Systems  Constitutional: Negative for fever, chills and diaphoresis.  HENT: Negative for congestion and rhinorrhea.   Respiratory: Negative for cough, shortness of breath and wheezing.   Cardiovascular: Negative for chest pain and leg swelling.  Gastrointestinal: Negative for nausea, vomiting, abdominal pain and diarrhea.  Genitourinary: Positive for vaginal discharge. Negative for dysuria, urgency, frequency, flank pain, vaginal bleeding and difficulty urinating.  Musculoskeletal: Positive for back pain. Negative for neck pain and neck stiffness.  Skin: Negative for rash.   Neurological: Negative for weakness, numbness and headaches.  All other systems reviewed and are negative.      Allergies  Ibuprofen; Latex; and Morphine and related  Home Medications   Current Outpatient Rx  Name  Route  Sig  Dispense  Refill  . etonogestrel (IMPLANON) 68 MG IMPL implant   Subcutaneous   Inject 1 each into the skin once.         . metroNIDAZOLE (FLAGYL) 500 MG tablet   Oral   Take 1 tablet (500 mg total) by mouth 2 (two) times daily.   14 tablet   0    BP 124/83  Pulse 61  Temp(Src) 98.6 F (37 C) (Oral)  Resp 18  Ht 5\' 3"  (1.6 m)  Wt 149 lb (67.586 kg)  BMI 26.40 kg/m2  SpO2 100%  LMP 02/16/2013 Physical Exam  Nursing note and vitals reviewed. Constitutional: She is oriented to person, place, and time. She appears well-developed and well-nourished. No distress.  HENT:  Head: Normocephalic and atraumatic.  Mouth/Throat: Oropharynx is clear and moist.  Eyes: Conjunctivae and EOM are normal. Pupils are equal, round, and reactive to light.  Neck: Normal range of motion. Neck supple.  Cardiovascular: Normal rate, regular rhythm, normal heart sounds and intact distal pulses.  Exam reveals no gallop and no friction rub.   No murmur heard. Pulmonary/Chest: Effort normal and breath sounds normal. No respiratory distress. She has no wheezes. She has no rales.  Abdominal: Soft. She exhibits no distension and no  mass. There is no tenderness. There is no rebound and no guarding. Hernia confirmed negative in the right inguinal area and confirmed negative in the left inguinal area.  Genitourinary: There is no rash, tenderness or lesion on the right labia. There is no rash, tenderness or lesion on the left labia. Cervix exhibits no motion tenderness and no friability. Right adnexum displays no mass, no tenderness and no fullness. Left adnexum displays no mass, no tenderness and no fullness. No erythema, tenderness or bleeding around the vagina. No foreign body  around the vagina. Vaginal discharge (mild, white) found.  Musculoskeletal: She exhibits no edema.  Neurological: She is alert and oriented to person, place, and time. No cranial nerve deficit. She exhibits normal muscle tone. Coordination normal.  Skin: Skin is warm. No rash noted.  Psychiatric: She has a normal mood and affect.    ED Course  Procedures (including critical care time) Labs Review Labs Reviewed  WET PREP, GENITAL - Abnormal; Notable for the following:    Clue Cells Wet Prep HPF POC FEW (*)    WBC, Wet Prep HPF POC FEW (*)    All other components within normal limits  URINALYSIS, ROUTINE W REFLEX MICROSCOPIC - Abnormal; Notable for the following:    Hgb urine dipstick TRACE (*)    All other components within normal limits  URINE MICROSCOPIC-ADD ON - Abnormal; Notable for the following:    Squamous Epithelial / LPF FEW (*)    All other components within normal limits  GC/CHLAMYDIA PROBE AMP  RPR  HIV ANTIBODY (ROUTINE TESTING)  POC URINE PREG, ED   Imaging Review No results found.   EKG Interpretation None      MDM   23 yo F with history of PID presents c/o vaginal discharge.  Also, concerned she may be pregnant d/t missed menstrual period, despite normal home preg test X 2.  ED pregnancy test negative.  No abdominal pain or systemic symptoms. Normal vitals.  Well appearing.  abd soft and non-tender.  Pelvic exam with mild white discharge, no CMT or adnexal mass. UA normal. Pregnancy test negative. Sent STD panel. + clue cells.  Will not treat empirically for STI.  Will await culture results.   Will treat for BV with flagyl.  Discussed medication side effects.  Advised she will be contact for positive STD panel results.  Gave ED return precautions.  Advised PCP followup.  Final diagnoses:  Vaginal discharge  Bacterial vaginosis   Discharge Medication List as of 03/28/2013  6:49 PM    START taking these medications   Details  metroNIDAZOLE (FLAGYL)  500 MG tablet Take 1 tablet (500 mg total) by mouth 2 (two) times daily., Starting 03/28/2013, Until Discontinued, Print           Toney Sang, MD 03/29/13 [phone]

## 2013-03-28 NOTE — ED Notes (Signed)
Pelvic cart at bedside. 

## 2013-03-28 NOTE — ED Notes (Signed)
Pt c/o lower back pain, white vaginal discharge, and that she missed her last menstrual cycle that was supposed to start on 03/16/13.  She also c/o constipation, but denies changes in urinary habits.

## 2013-03-29 LAB — GC/CHLAMYDIA PROBE AMP
CT Probe RNA: NEGATIVE
GC PROBE AMP APTIMA: NEGATIVE

## 2013-03-29 NOTE — ED Provider Notes (Signed)
I saw and evaluated the patient, reviewed the resident's note and I agree with the findings and plan.   EKG Interpretation None      Vaginal bleeding, not pregnant, labs reviewed  Charles B. Bernette MayersSheldon, MD 03/29/13 [phone]

## 2013-06-26 ENCOUNTER — Emergency Department (HOSPITAL_COMMUNITY)
Admission: EM | Admit: 2013-06-26 | Discharge: 2013-06-26 | Discharge status: Against Medical Advice | Payer: BC Managed Care – PPO | Attending: Emergency Medicine | Admitting: Emergency Medicine

## 2013-06-26 ENCOUNTER — Encounter (HOSPITAL_COMMUNITY): Payer: Self-pay | Admitting: Emergency Medicine

## 2013-06-26 DIAGNOSIS — F17200 Nicotine dependence, unspecified, uncomplicated: Secondary | ICD-10-CM | POA: Insufficient documentation

## 2013-06-26 DIAGNOSIS — J45909 Unspecified asthma, uncomplicated: Secondary | ICD-10-CM | POA: Insufficient documentation

## 2013-06-26 DIAGNOSIS — R109 Unspecified abdominal pain: Secondary | ICD-10-CM | POA: Insufficient documentation

## 2013-06-26 DIAGNOSIS — N898 Other specified noninflammatory disorders of vagina: Secondary | ICD-10-CM | POA: Insufficient documentation

## 2013-06-26 LAB — URINALYSIS, ROUTINE W REFLEX MICROSCOPIC
Glucose, UA: NEGATIVE mg/dL
KETONES UR: 15 mg/dL — AB
Nitrite: NEGATIVE
PH: 6.5 (ref 5.0–8.0)
PROTEIN: NEGATIVE mg/dL
Specific Gravity, Urine: 1.029 (ref 1.005–1.030)
Urobilinogen, UA: 1 mg/dL (ref 0.0–1.0)

## 2013-06-26 LAB — URINE MICROSCOPIC-ADD ON

## 2013-06-26 LAB — PREGNANCY, URINE: PREG TEST UR: NEGATIVE

## 2013-06-26 NOTE — ED Notes (Signed)
Patient came to the desk and to the desk and said she was leaving

## 2013-06-26 NOTE — ED Notes (Signed)
Pt. reports vaginal irritation with discharge ( thick yellow / brownish ) / low abdominal cramping onset last week . Denies dysuria or fever .

## 2013-06-27 ENCOUNTER — Emergency Department (HOSPITAL_COMMUNITY)
Admission: EM | Admit: 2013-06-27 | Discharge: 2013-06-27 | Disposition: A | Discharge status: Home / Self Care | Payer: BC Managed Care – PPO | Attending: Emergency Medicine | Admitting: Emergency Medicine

## 2013-06-27 ENCOUNTER — Encounter (HOSPITAL_COMMUNITY): Payer: Self-pay | Admitting: Emergency Medicine

## 2013-06-27 DIAGNOSIS — K589 Irritable bowel syndrome without diarrhea: Secondary | ICD-10-CM | POA: Insufficient documentation

## 2013-06-27 DIAGNOSIS — J45909 Unspecified asthma, uncomplicated: Secondary | ICD-10-CM | POA: Insufficient documentation

## 2013-06-27 DIAGNOSIS — F17200 Nicotine dependence, unspecified, uncomplicated: Secondary | ICD-10-CM | POA: Insufficient documentation

## 2013-06-27 DIAGNOSIS — B009 Herpesviral infection, unspecified: Secondary | ICD-10-CM | POA: Insufficient documentation

## 2013-06-27 DIAGNOSIS — Z91040 Latex allergy status: Secondary | ICD-10-CM | POA: Insufficient documentation

## 2013-06-27 DIAGNOSIS — Z3202 Encounter for pregnancy test, result negative: Secondary | ICD-10-CM | POA: Insufficient documentation

## 2013-06-27 DIAGNOSIS — A6000 Herpesviral infection of urogenital system, unspecified: Secondary | ICD-10-CM

## 2013-06-27 LAB — CBC WITH DIFFERENTIAL/PLATELET
BASOS PCT: 1 % (ref 0–1)
Basophils Absolute: 0 10*3/uL (ref 0.0–0.1)
Eosinophils Absolute: 0.1 10*3/uL (ref 0.0–0.7)
Eosinophils Relative: 2 % (ref 0–5)
HCT: 40.7 % (ref 36.0–46.0)
Hemoglobin: 14.1 g/dL (ref 12.0–15.0)
LYMPHS ABS: 1.7 10*3/uL (ref 0.7–4.0)
Lymphocytes Relative: 29 % (ref 12–46)
MCH: 30.9 pg (ref 26.0–34.0)
MCHC: 34.6 g/dL (ref 30.0–36.0)
MCV: 89.3 fL (ref 78.0–100.0)
Monocytes Absolute: 0.5 10*3/uL (ref 0.1–1.0)
Monocytes Relative: 8 % (ref 3–12)
NEUTROS PCT: 60 % (ref 43–77)
Neutro Abs: 3.5 10*3/uL (ref 1.7–7.7)
PLATELETS: 202 10*3/uL (ref 150–400)
RBC: 4.56 MIL/uL (ref 3.87–5.11)
RDW: 12.6 % (ref 11.5–15.5)
WBC: 5.8 10*3/uL (ref 4.0–10.5)

## 2013-06-27 LAB — COMPREHENSIVE METABOLIC PANEL
ALBUMIN: 4.4 g/dL (ref 3.5–5.2)
ALK PHOS: 62 U/L (ref 39–117)
ALT: 10 U/L (ref 0–35)
AST: 16 U/L (ref 0–37)
BUN: 5 mg/dL — ABNORMAL LOW (ref 6–23)
CO2: 21 mEq/L (ref 19–32)
Calcium: 9.5 mg/dL (ref 8.4–10.5)
Chloride: 101 mEq/L (ref 96–112)
Creatinine, Ser: 0.63 mg/dL (ref 0.50–1.10)
GFR calc Af Amer: >90 mL/min (ref >90)
GFR calc non Af Amer: >90 mL/min (ref >90)
GLUCOSE: 94 mg/dL (ref 70–99)
POTASSIUM: 3.5 meq/L — AB (ref 3.7–5.3)
Sodium: 140 mEq/L (ref 137–147)
TOTAL PROTEIN: 7.8 g/dL (ref 6.0–8.3)
Total Bilirubin: 0.8 mg/dL (ref 0.3–1.2)

## 2013-06-27 LAB — WET PREP, GENITAL
TRICH WET PREP: NONE SEEN
YEAST WET PREP: NONE SEEN

## 2013-06-27 LAB — URINALYSIS, ROUTINE W REFLEX MICROSCOPIC
Glucose, UA: NEGATIVE mg/dL
Ketones, ur: 15 mg/dL — AB
Nitrite: NEGATIVE
Protein, ur: 30 mg/dL — AB
SPECIFIC GRAVITY, URINE: 1.031 — AB (ref 1.005–1.030)
Urobilinogen, UA: 1 mg/dL (ref 0.0–1.0)
pH: 6 (ref 5.0–8.0)

## 2013-06-27 LAB — URINE MICROSCOPIC-ADD ON

## 2013-06-27 LAB — HIV ANTIBODY (ROUTINE TESTING W REFLEX): HIV: NONREACTIVE

## 2013-06-27 LAB — POC URINE PREG, ED: Preg Test, Ur: NEGATIVE

## 2013-06-27 LAB — RPR

## 2013-06-27 MED ORDER — VALGANCICLOVIR HCL 450 MG PO TABS: 450.0000 mg | ORAL_TABLET | Freq: Once | ORAL | Status: DC

## 2013-06-27 MED ORDER — VALACYCLOVIR HCL 1 G PO TABS: 1000.0000 mg | ORAL_TABLET | Freq: Two times a day (BID) | ORAL | Status: AC

## 2013-06-27 MED ORDER — VALACYCLOVIR HCL 500 MG PO TABS: 500.0000 mg | ORAL_TABLET | Freq: Once | ORAL | Status: DC

## 2013-06-27 MED ORDER — VALACYCLOVIR HCL 500 MG PO TABS
1000.0000 mg | ORAL_TABLET | Freq: Once | ORAL | Status: AC
  Administered 2013-06-27: 1000 mg via ORAL
  Filled 2013-06-27: qty 2

## 2013-06-27 MED ORDER — DICYCLOMINE HCL 20 MG PO TABS: 20.0000 mg | ORAL_TABLET | Freq: Two times a day (BID) | ORAL | Status: DC

## 2013-06-27 NOTE — ED Notes (Signed)
Phlebotomy at bedside.

## 2013-06-27 NOTE — ED Provider Notes (Signed)
CSN: [id number]     Arrival date & time 06/27/13  1051 History   First MD Initiated Contact with Patient 06/27/13 1141     Chief Complaint  Patient presents with  . Abdominal Pain  . Vaginal Discharge     (Consider location/radiation/quality/duration/timing/severity/associated sxs/prior Treatment) HPI  23 year old female who presents for evaluation of abdominal pain and vaginal discharge. Patient reports for the past 2-3 months she has been having recurrent low abnormal pain. She described pain as a achy and crampy sensation, low abdomen, worse in the morning again present throughout the day, waxing and waning usually lasting for 30 minutes to an hour. Pain improves with having bowel movement. States she has a bowel movement daily with no blood. Reports occasional nausea and vomiting but not persistent. For the past several days she has noticed a bump on her vaginal lip that is tender to palpation. She also notice vaginal discharge for the past 2 days. No hematuria, no vaginal bleeding, no dysuria, and no other rash. No pain with sexual activities. Her last menstrual period was 2 weeks ago.  Past Medical History  Diagnosis Date  . Asthma    History reviewed. No pertinent past surgical history. No family history on file. History  Substance Use Topics  . Smoking status: Current Every Day Smoker -- 0.50 packs/day    Types: Cigarettes  . Smokeless tobacco: Not on file  . Alcohol Use: Yes     Comment: on weekends   OB History   Grav Para Term Preterm Abortions TAB SAB Ect Mult Living                 Review of Systems  Constitutional: Negative for fever.  Gastrointestinal: Positive for abdominal pain.  Genitourinary: Positive for vaginal discharge, genital sores and pelvic pain. Negative for dysuria, flank pain and vaginal bleeding.  Skin: Negative for rash and wound.  All other systems reviewed and are negative.     Allergies  Ibuprofen; Other; Latex; and Morphine and  related  Home Medications   Prior to Admission medications   Medication Sig Start Date End Date Taking? Authorizing Provider  etonogestrel (IMPLANON) 68 MG IMPL implant Inject 1 each into the skin once.    Historical Provider, MD   BP 129/74  Pulse 87  Temp(Src) 98.8 F (37.1 C) (Oral)  Resp 18  Wt 138 lb 14.4 oz (63.005 kg)  SpO2 100% Physical Exam  Nursing note and vitals reviewed. Constitutional: She appears well-developed and well-nourished. No distress.  HENT:  Head: Normocephalic and atraumatic.  Eyes: Conjunctivae are normal.  Neck: Normal range of motion. Neck supple.  Cardiovascular: Normal rate and regular rhythm.   Pulmonary/Chest: Effort normal and breath sounds normal. She exhibits no tenderness.  Abdominal: Soft. There is no tenderness.  Genitourinary: Vagina normal and uterus normal. There is no rash or lesion on the right labia. There is tenderness and lesion on the left labia. Cervix exhibits no motion tenderness and no discharge. Right adnexum displays no mass and no tenderness. Left adnexum displays no mass and no tenderness. No erythema, tenderness or bleeding around the vagina. No vaginal discharge found.  Chaperone present  On pelvic examination, patient has left inguinal adenopathy noted that is tender to palpation. On external examination, patient has herpetic lesions noted to the left labia majora, tender to palpation without any evidence of abscess. Vaginal vault with vaginal discharge noted. Cervical os is closed. On bimanual examination, patient has no left or right adnexal tenderness and  no cervical motion tenderness   Lymphadenopathy:       Right: No inguinal adenopathy present.       Left: Inguinal adenopathy present.    ED Course  Procedures (including critical care time)  Patient with recurrent low abnormal pain suggestive of IBS given her history and age. She has a benign abdomen on exam. She was diagnosed with PID in the past will follow pain  from prior visits however I review her GC and Chlamydia cultures and it has always been negative. She has no other evidence of documented STD.  12:34 PM Patient has herpetic lesions noted to the left labia majora suspicious for herpes simplex type II. She has no cervical motion tenderness and no evidence to suggest PID.  2:27 PM Wet prep unremarkable.  Will treat genital herpes with valtrex.  Pt exhibits sxs if IBS, bentyl prescribed.  Resources given.  Return precaution discussed.  No dysuria there fore doubt UTI.    Labs Review Labs Reviewed  WET PREP, GENITAL - Abnormal; Notable for the following:    Clue Cells Wet Prep HPF POC FEW (*)    WBC, Wet Prep HPF POC FEW (*)    All other components within normal limits  COMPREHENSIVE METABOLIC PANEL - Abnormal; Notable for the following:    Potassium 3.5 (*)    BUN 5 (*)    All other components within normal limits  URINALYSIS, ROUTINE W REFLEX MICROSCOPIC - Abnormal; Notable for the following:    Color, Urine AMBER (*)    APPearance CLOUDY (*)    Specific Gravity, Urine 1.031 (*)    Hgb urine dipstick SMALL (*)    Bilirubin Urine SMALL (*)    Ketones, ur 15 (*)    Protein, ur 30 (*)    Leukocytes, UA MODERATE (*)    All other components within normal limits  URINE MICROSCOPIC-ADD ON - Abnormal; Notable for the following:    Squamous Epithelial / LPF MANY (*)    Bacteria, UA FEW (*)    All other components within normal limits  GC/CHLAMYDIA PROBE AMP  CBC WITH DIFFERENTIAL  RPR  HIV ANTIBODY (ROUTINE TESTING)  POC URINE PREG, ED    Imaging Review No results found.   EKG Interpretation None      MDM   Final diagnoses:  Genital herpes  IBS (irritable bowel syndrome)    BP 130/78  Pulse 62  Temp(Src) 98.8 F (37.1 C) (Oral)  Resp 12  Wt 138 lb 14.4 oz (63.005 kg)  SpO2 100%     Fayrene HelperBowie Tran, PA-C 06/27/13 1428

## 2013-06-27 NOTE — ED Provider Notes (Signed)
Medical screening examination/treatment/procedure(s) were performed by non-physician practitioner and as supervising physician I was immediately available for consultation/collaboration.   EKG Interpretation None       Doug SouSam Jacubowitz, MD 06/27/13 1902

## 2013-06-27 NOTE — Discharge Instructions (Signed)
Genital Herpes Genital herpes is a sexually transmitted disease. This means that it is a disease passed by having sex with an infected person. There is no cure for genital herpes. The time between attacks can be months to years. The virus may live in a person but produce no problems (symptoms). This infection can be passed to a baby as it travels down the birth canal (vagina). In a newborn, this can cause central nervous system damage, eye damage, or even death. The virus that causes genital herpes is usually HSV-2 virus. The virus that causes oral herpes is usually HSV-1. The diagnosis (learning what is wrong) is made through culture results. SYMPTOMS  Usually symptoms of pain and itching begin a few days to a week after contact. It first appears as small blisters that progress to small painful ulcers which then scab over and heal after several days. It affects the outer genitalia, birth canal, cervix, penis, anal area, buttocks, and thighs. HOME CARE INSTRUCTIONS   Keep ulcerated areas dry and clean.  Take medications as directed. Antiviral medications can speed up healing. They will not prevent recurrences or cure this infection. These medications can also be taken for suppression if there are frequent recurrences.  While the infection is active, it is contagious. Avoid all sexual contact during active infections.  Condoms may help prevent spread of the herpes virus.  Practice safe sex.  Wash your hands thoroughly after touching the genital area.  Avoid touching your eyes after touching your genital area.  Inform your caregiver if you have had genital herpes and become pregnant. It is your responsibility to insure a safe outcome for your baby in this pregnancy.  Only take over-the-counter or prescription medicines for pain, discomfort, or fever as directed by your caregiver. SEEK MEDICAL CARE IF:   You have a recurrence of this infection.  You do not respond to medications and are not  improving.  You have new sources of pain or discharge which have changed from the original infection.  You have an oral temperature above 102 F (38.9 C).  You develop abdominal pain.  You develop eye pain or signs of eye infection. Document Released: 12/18/1999 Document Revised: 03/14/2011 Document Reviewed: 01/07/2009 [facility] Patient Information 2015 [place], [place]. This information is not intended to replace advice given to you by your health care provider. Make sure you discuss any questions you have with your health care provider.  Irritable Bowel Syndrome Irritable bowel syndrome (IBS) is caused by a disturbance of normal bowel function and is a common digestive disorder. You may also hear this condition called spastic colon, mucous colitis, and irritable colon. There is no cure for IBS. However, symptoms often gradually improve or disappear with a good diet, stress management, and medicine. This condition usually appears in late adolescence or early adulthood. Women develop it twice as often as men. CAUSES  After food has been digested and absorbed in the small intestine, waste material is moved into the large intestine, or colon. In the colon, water and salts are absorbed from the undigested products coming from the small intestine. The remaining residue, or fecal material, is held for elimination. Under normal circumstances, gentle, rhythmic contractions of the bowel walls push the fecal material along the colon toward the rectum. In IBS, however, these contractions are irregular and poorly coordinated. The fecal material is either retained too long, resulting in constipation, or expelled too soon, producing diarrhea. SIGNS AND SYMPTOMS  The most common symptom of IBS is abdominal  pain. It is often in the lower left side of the abdomen, but it may occur anywhere in the abdomen. The pain comes from spasms of the bowel muscles happening too much and from the buildup of gas and fecal  material in the colon. This pain:  Can range from sharp abdominal cramps to a dull, continuous ache.  Often worsens soon after eating.  Is often relieved by having a bowel movement or passing gas. Abdominal pain is usually accompanied by constipation, but it may also produce diarrhea. The diarrhea often occurs right after a meal or upon waking up in the morning. The stools are often soft, watery, and flecked with mucus. Other symptoms of IBS include:  Bloating.  Loss of appetite.  Heartburn.  Backache.  Dull pain in the arms or shoulders.  Nausea.  Burping.  Vomiting.  Gas. IBS may also cause symptoms that are unrelated to the digestive system, such as:  Fatigue.  Headaches.  Anxiety.  Shortness of breath.  Trouble concentrating.  Dizziness. These symptoms tend to come and go. DIAGNOSIS  The symptoms of IBS may seem like symptoms of other, more serious digestive disorders. Your health care provider may want to perform tests to exclude these disorders.  TREATMENT Many medicines are available to help correct bowel function or relieve bowel spasms and abdominal pain. Among the medicines available are:  Laxatives for severe constipation and to help restore normal bowel habits.  Specific antidiarrheal medicines to treat severe or lasting diarrhea.  Antispasmodic agents to relieve intestinal cramps. Your health care provider may also decide to treat you with a mild tranquilizer or sedative during unusually stressful periods in your life. Your health care provider may also prescribe antidepressant medicine. The use of this medicine has been shown to reduce pain and other symptoms of IBS. Remember that if any medicine is prescribed for you, you should take it exactly as directed. Make sure your health care provider knows how well it worked for you. HOME CARE INSTRUCTIONS   Take all medicines as directed by your health care provider.  Avoid foods that are high in fat or  oils, such as heavy cream, butter, frankfurters, sausage, and other fatty meats.  Avoid foods that make you go to the bathroom, such as fruit, fruit juice, and dairy products.  Cut out carbonated drinks, chewing gum, and "gassy" foods such as beans and cabbage. This may help relieve bloating and burping.  Eat foods with bran, and drink plenty of liquids with the bran foods. This helps relieve constipation.  Keep track of what foods seem to bring on your symptoms.  Avoid emotionally charged situations or circumstances that produce anxiety.  Start or continue exercising.  Get plenty of rest and sleep. Document Released: 12/20/2004 Document Revised: 12/25/2012 Document Reviewed: 08/10/2007 [facility]Exit[facility] Patient Information 2015 [place], [place]. This information is not intended to replace advice given to you by your health care provider. Make sure you discuss any questions you have with your health care provider.

## 2013-06-27 NOTE — ED Notes (Signed)
Pt reports abdominal pain for the last couple of months. States no PCP. States yesterday began having vaginal discharge and dysuria. Pt awake, alert, NAD at present.

## 2013-06-28 LAB — GC/CHLAMYDIA PROBE AMP
CT PROBE, AMP APTIMA: NEGATIVE
GC Probe RNA: NEGATIVE

## 2013-07-08 ENCOUNTER — Ambulatory Visit: Payer: BC Managed Care – PPO | Admitting: Internal Medicine

## 2014-10-22 ENCOUNTER — Emergency Department (HOSPITAL_COMMUNITY): Payer: Self-pay

## 2014-10-22 ENCOUNTER — Encounter (HOSPITAL_COMMUNITY): Payer: Self-pay | Admitting: Emergency Medicine

## 2014-10-22 ENCOUNTER — Emergency Department (HOSPITAL_COMMUNITY)
Admission: EM | Admit: 2014-10-22 | Discharge: 2014-10-22 | Disposition: A | Discharge status: Home / Self Care | Payer: Self-pay | Attending: Emergency Medicine | Admitting: Emergency Medicine

## 2014-10-22 DIAGNOSIS — S99912A Unspecified injury of left ankle, initial encounter: Secondary | ICD-10-CM | POA: Insufficient documentation

## 2014-10-22 DIAGNOSIS — Y9389 Activity, other specified: Secondary | ICD-10-CM | POA: Insufficient documentation

## 2014-10-22 DIAGNOSIS — W1840XA Slipping, tripping and stumbling without falling, unspecified, initial encounter: Secondary | ICD-10-CM | POA: Insufficient documentation

## 2014-10-22 DIAGNOSIS — J45909 Unspecified asthma, uncomplicated: Secondary | ICD-10-CM | POA: Insufficient documentation

## 2014-10-22 DIAGNOSIS — Z79899 Other long term (current) drug therapy: Secondary | ICD-10-CM | POA: Insufficient documentation

## 2014-10-22 DIAGNOSIS — Z720 Tobacco use: Secondary | ICD-10-CM | POA: Insufficient documentation

## 2014-10-22 DIAGNOSIS — M25572 Pain in left ankle and joints of left foot: Secondary | ICD-10-CM

## 2014-10-22 DIAGNOSIS — Z91040 Latex allergy status: Secondary | ICD-10-CM | POA: Insufficient documentation

## 2014-10-22 DIAGNOSIS — Y9289 Other specified places as the place of occurrence of the external cause: Secondary | ICD-10-CM | POA: Insufficient documentation

## 2014-10-22 DIAGNOSIS — Y998 Other external cause status: Secondary | ICD-10-CM | POA: Insufficient documentation

## 2014-10-22 MED ORDER — KETOROLAC TROMETHAMINE 10 MG PO TABS: 10.0000 mg | ORAL_TABLET | Freq: Four times a day (QID) | ORAL | Status: DC | PRN

## 2014-10-22 NOTE — ED Notes (Signed)
Patient states tripped down some steps when she missed one Sunday night.  Patient complains of L ankle pain and swelling.

## 2014-10-22 NOTE — ED Provider Notes (Signed)
CSN: [id number]     Arrival date & time 10/22/14  0865 History   First MD Initiated Contact with Patient 10/22/14 [phone]     Chief Complaint  Patient presents with  . Ankle Injury     (Consider location/radiation/quality/duration/timing/severity/associated sxs/prior Treatment) HPI  KALKIDAN CAUDELL is a 24 y.o. female, pt with no pertinent medical history, presents with left ankle pain following a stumble on stairs 4 days ago. Pt denies falling down completely. Denies hitting her head, LOC, N/V, or any other pain or injuries.  Currently rates the pain at 6/10, non-radiating, throbbing in nature. Tried tylenol, with last dose at 8:15 this morning with some decrease in pain. Pt able to bear weight with no gait abnormality. Pt specifies that she is allergic to ibuprofen and states "it makes my throat close."  Past Medical History  Diagnosis Date  . Asthma    History reviewed. No pertinent past surgical history. No family history on file. Social History  Substance Use Topics  . Smoking status: Current Every Day Smoker -- 0.50 packs/day    Types: Cigarettes  . Smokeless tobacco: None  . Alcohol Use: Yes     Comment: on weekends   OB History    No data available     Review of Systems  Musculoskeletal: Positive for arthralgias. Negative for back pain, joint swelling, gait problem and neck pain.       Left ankle pain  Neurological: Negative for dizziness, weakness, light-headedness and numbness.      Allergies  Ibuprofen; Other; Latex; and Morphine and related  Home Medications   Prior to Admission medications   Medication Sig Start Date End Date Taking? Authorizing Provider  dicyclomine (BENTYL) 20 MG tablet Take 1 tablet (20 mg total) by mouth 2 (two) times daily. 06/27/13   Fayrene Helper, PA-C  etonogestrel (IMPLANON) 68 MG IMPL implant Inject 1 each into the skin once.    Historical Provider, MD  ketorolac (TORADOL) 10 MG tablet Take 1 tablet (10 mg total) by mouth every 6 (six)  hours as needed. 10/22/14   Metzli Pollick C Eupha Lobb, PA-C   BP 139/81 mmHg  Pulse 62  Temp(Src) 98 F (36.7 C) (Oral)  Resp 16  SpO2 100% Physical Exam  Constitutional: She appears well-developed and well-nourished.  HENT:  Head: Normocephalic and atraumatic.  Eyes: Conjunctivae are normal. Pupils are equal, round, and reactive to light.  Musculoskeletal:  Full ROM bilaterally. No gait abnormality. Full weight-bearing.  Neurological: She is alert.  No numbness, tingling, or other neurologic deficit. Strength 5/5 bilaterally.   Skin: Skin is warm and dry.  Nursing note and vitals reviewed.   ED Course  Procedures (including critical care time) Labs Review Labs Reviewed - No data to display  Imaging Review Dg Ankle Complete Left  10/22/2014  CLINICAL DATA:  Twisted left ankle 3 days ago EXAM: LEFT ANKLE COMPLETE - 3+ VIEW COMPARISON:  None. FINDINGS: There is no evidence of fracture, dislocation, or joint effusion. There is no evidence of arthropathy or other focal bone abnormality. Soft tissues are unremarkable. IMPRESSION: Negative. Electronically Signed   By: Elige Ko   On: 10/22/2014 09:52   I have personally reviewed and evaluated these images and lab results as part of my medical decision-making.   EKG Interpretation None      MDM   Final diagnoses:  Ankle pain, left    CORRYN MADEWELL presents with left ankle pain following a stumble.   If xray clear from  fracture, will discharge pt with anti-inflammatory medication and ankle brace. Spoke with [place] HunAllison Masters, pharmacist, regarding anti-inflammatory medication recommendation given pt serious reaction to ibuprofen. Revonda Standardllison advises Ketorolac 10mg  q8h PRN.   Ankle xray free from fracture.      Anselm PancoastShawn C Ilianna Bown, PA-C 10/22/14 1004  Gilda Creasehristopher J Pollina, MD 10/22/14 1229

## 2014-10-22 NOTE — Progress Notes (Signed)
Spoke to patient regarding primary care resources and [facility]. Orange card application provided and explained. Patient instructed to contact me once application is complete to obtain an enrollment appointment and to be set up with a pcp. Resource guide and my contact information provided for any future questions or concerns. No other [facility] Eligibility Specialist needs identified at this time.  Buddy DutyFelicia Evans [facility] LLCCommunity [facility] & Eligibility Specialist [facility] [phone]

## 2014-10-22 NOTE — Discharge Instructions (Signed)
You have been seen today for ankle pain.  Your imaging showed no evidence of fracture. It is recommended that you use the ketorolac for pain and anti-inflammatory management.    Ankle Pain Ankle pain is a common symptom. The bones, cartilage, tendons, and muscles of the ankle joint perform a lot of work each day. The ankle joint holds your body weight and allows you to move around. Ankle pain can occur on either side or back of 1 or both ankles. Ankle pain may be sharp and burning or dull and aching. There may be tenderness, stiffness, redness, or warmth around the ankle. The pain occurs more often when a person walks or puts pressure on the ankle. CAUSES  There are many reasons ankle pain can develop. It is important to work with your caregiver to identify the cause since many conditions can impact the bones, cartilage, muscles, and tendons. Causes for ankle pain include:  Injury, including a break (fracture), sprain, or strain often due to a fall, sports, or a high-impact activity.  Swelling (inflammation) of a tendon (tendonitis).  Achilles tendon rupture.  Ankle instability after repeated sprains and strains.  Poor foot alignment.  Pressure on a nerve (tarsal tunnel syndrome).  Arthritis in the ankle or the lining of the ankle.  Crystal formation in the ankle (gout or pseudogout). DIAGNOSIS  A diagnosis is based on your medical history, your symptoms, results of your physical exam, and results of diagnostic tests. Diagnostic tests may include X-ray exams or a computerized magnetic scan (magnetic resonance imaging, MRI). TREATMENT  Treatment will depend on the cause of your ankle pain and may include:  Keeping pressure off the ankle and limiting activities.  Using crutches or other walking support (a cane or brace).  Using rest, ice, compression, and elevation.  Participating in physical therapy or home exercises.  Wearing shoe inserts or special shoes.  Losing  weight.  Taking medications to reduce pain or swelling or receiving an injection.  Undergoing surgery. HOME CARE INSTRUCTIONS   Only take over-the-counter or prescription medicines for pain, discomfort, or fever as directed by your caregiver.  Put ice on the injured area.  Put ice in a plastic bag.  Place a towel between your skin and the bag.  Leave the ice on for 15-20 minutes at a time, 03-04 times a day.  Keep your leg raised (elevated) when possible to lessen swelling.  Avoid activities that cause ankle pain.  Follow specific exercises as directed by your caregiver.  Record how often you have ankle pain, the location of the pain, and what it feels like. This information may be helpful to you and your caregiver.  Ask your caregiver about returning to work or sports and whether you should drive.  Follow up with your caregiver for further examination, therapy, or testing as directed. SEEK MEDICAL CARE IF:   Pain or swelling continues or worsens beyond 1 week.  You have an oral temperature above 102 F (38.9 C).  You are feeling unwell or have chills.  You are having an increasingly difficult time with walking.  You have loss of sensation or other new symptoms.  You have questions or concerns. MAKE SURE YOU:   Understand these instructions.  Will watch your condition.  Will get help right away if you are not doing well or get worse.   This information is not intended to replace advice given to you by your health care provider. Make sure you discuss any questions you have  with your health care provider.   Document Released: 06/09/2009 Document Revised: 03/14/2011 Document Reviewed: 07/22/2014 Elsevier Interactive Patient Education 2016 Elsevier Inc.  Cryotherapy Cryotherapy is when you put ice on your injury. Ice helps lessen pain and puffiness (swelling) after an injury. Ice works the best when you start using it in the first 24 to 48 hours after an  injury. HOME CARE  Put a dry or damp towel between the ice pack and your skin.  You may press gently on the ice pack.  Leave the ice on for no more than 10 to 20 minutes at a time.  Check your skin after 5 minutes to make sure your skin is okay.  Rest at least 20 minutes between ice pack uses.  Stop using ice when your skin loses feeling (numbness).  Do not use ice on someone who cannot tell you when it hurts. This includes small children and people with memory problems (dementia). GET HELP RIGHT AWAY IF:  You have white spots on your skin.  Your skin turns blue or pale.  Your skin feels waxy or hard.  Your puffiness gets worse. MAKE SURE YOU:   Understand these instructions.  Will watch your condition.  Will get help right away if you are not doing well or get worse.   This information is not intended to replace advice given to you by your health care provider. Make sure you discuss any questions you have with your health care provider.   Document Released: 06/08/2007 Document Revised: 03/14/2011 Document Reviewed: 08/12/2010 Elsevier Interactive Patient Education 2016 Elsevier Inc.  [employer] Therapy Heat therapy can help ease sore, stiff, injured, and tight muscles and joints. Heat relaxes your muscles, which may help ease your pain. Heat therapy should only be used on old, pre-existing, or long-lasting (chronic) injuries. Do not use heat therapy unless told by your doctor. HOW TO USE HEAT THERAPY There are several different kinds of heat therapy, including:  Moist heat pack.  Warm water bath.  Hot water bottle.  Electric heating pad.  Heated gel pack.  Heated wrap.  Electric heating pad. GENERAL HEAT THERAPY RECOMMENDATIONS   Do not sleep while using heat therapy. Only use heat therapy while you are awake.  Your skin may turn pink while using heat therapy. Do not use heat therapy if your skin turns red.  Do not use heat therapy if you have new  pain.  High heat or long exposure to heat can cause burns. Be careful when using heat therapy to avoid burning your skin.  Do not use heat therapy on areas of your skin that are already irritated, such as with a rash or sunburn. GET HELP IF:   You have blisters, redness, swelling (puffiness), or numbness.  You have new pain.  Your pain is worse. MAKE SURE YOU:  Understand these instructions.  Will watch your condition.  Will get help right away if you are not doing well or get worse.   This information is not intended to replace advice given to you by your health care provider. Make sure you discuss any questions you have with your health care provider.   Document Released: 03/14/2011 Document Revised: 01/10/2014 Document Reviewed: 02/12/2013 Elsevier Interactive Patient Education 2016 [employer].   Emergency Department Resource Guide 1) Find a Doctor and Pay Out of Pocket Although you won't have to find out who is covered by your insurance plan, it is a good idea to ask around and get recommendations. You will  then need to call the office and see if the doctor you have chosen will accept you as a new patient and what types of options they offer for patients who are self-pay. Some doctors offer discounts or will set up payment plans for their patients who do not have insurance, but you will need to ask so you aren't surprised when you get to your appointment.  2) Contact Your Local Health Department Not all health departments have doctors that can see patients for sick visits, but many do, so it is worth a call to see if yours does. If you don't know where your local health department is, you can check in your phone book. The CDC also has a tool to help you locate your state's health department, and many state websites also have listings of all of their local health departments.  3) Find a Walk-in Clinic If your illness is not likely to be very severe or complicated, you may want  to try a walk in clinic. These are popping up all over the country in pharmacies, drugstores, and shopping centers. They're usually staffed by nurse practitioners or physician assistants that have been trained to treat common illnesses and complaints. They're usually fairly quick and inexpensive. However, if you have serious medical issues or chronic medical problems, these are probably not your best option.  No Primary Care Doctor: - Call Health Connect at  [phone] - they can help you locate a primary care doctor that  accepts your insurance, provides certain services, etc. - Physician Referral Service- [phone]  Chronic Pain Problems: Organization         Address  Phone   Notes  [facility]  [phone] Patients need to be referred by their primary care doctor.   Medication Assistance: Organization         Address  Phone   Notes  [facility] Medication [facility] [address] [place], [place] [address] [phone] --Must be a resident of [facility] -- Must have NO insurance coverage whatsoever (no Medicaid/ Medicare, etc.) -- The pt. MUST have a primary care doctor that directs their care regularly and follows them in the community   [facility]ist  [phone]   [employer]  [phone]    Agencies that provide inexpensive medical care: Organization         Address  Phone   Notes  [facility]  [phone]   [facility]    [phone]   [facility] [address] [place], [place] [address] [phone]   [facility] [address] [place], [place] [phone]   Planned Parenthood    [phone]   [facility]    [phone]   [facility] and [facility]  [address], [place] Phone:  [phone], Fax:  [phone] Hours of Operation:  9 am - 6 pm, M-F.  Also accepts Medicaid/Medicare and self-pay.   [facility] for Children  [address], [place] Phone: [phone], Fax: [phone]. Hours of Operation:  8:30 am - 5:30 pm, M-F.  Also accepts Medicaid and self-pay.  [facility] [place] [address], [place] Phone: [phone]   [facility] [address] Natasha Bence [place], [place] [phone], Ext. [phone] Mondays & Thursdays: 7-9 AM.  First 15 patients are seen on a first come, first serve  basis.    Medicaid-accepting [facility] Providers:  Organization         Address  Phone   Notes  [facility] [address], [place] [phone]) [phone] Also accepts self-pay patients.  [facility] [address], [place]  [phone]) [phone]   [facility] [address], [place] [phone]   [facility] [address] [place] [address], [place] [phone]) [phone]   Renaye RakersVeita Bland [address], [place]   [phone]) [phone] Only accepts [place] Access [place] patients after they have their name applied to their card.   Self-Pay (no insurance) in [facility]:  Organization         Address  Phone   Notes  Sickle Cell Patients, [facility] [address], [place] [phone]) [phone]   [facility] [address], [place] [phone]   [facility]  [address], [place] [phone]   [facility]/Dr. Osei-Bonsu  [address] [place] [address], [place] or [phone], [place] [phone] Phone number for both [place] and [place] locations is the same.  [facility] [address], [place] [phone]) [phone]   [facility] [place] [address] [place] [address], [place] or [address] [phone]) [phone]) [phone]   [facility] [address], [place] [phone]) [phone], phone; [phone])  [phone], fax Sees patients 1st and 3rd Saturday of every month.  Must not qualify for public or private insurance (i.e. Medicaid, Medicare, [place] Health Choice, Veterans' Benefits)  Household income should be no more than 200% of the poverty level The clinic cannot treat you if you are pregnant or think you are pregnant  Sexually transmitted diseases are not treated at the clinic.    Dental Care: Organization         Address  Phone  Notes  [facility] of [facility] [address], [place] [phone] Accepts children up to age 25 who are enrolled in [place] or [place] Health Choice; pregnant women with a Medicaid card; and children who have applied for Medicaid or [place] Health Choice, but were declined, whose parents can pay a reduced fee at time of service.  [facility] of [facility] [place]  [address], [place] [phone] Accepts children up to age 25 who are enrolled in [place] or [place] Health Choice; pregnant women with a Medicaid card; and children who have applied for Medicaid or [place] Health Choice, but were declined, whose parents can pay a reduced fee at time of service.  [facility]  [address], [place] [phone]) [phone] Patients are seen by appointment only. Walk-ins are not accepted. [facility] will see patients 25 years of age and older. Monday - Tuesday (8am-5pm) Most Wednesdays (8:30-5pm) $30 per visit, cash only  [facility]  [address], [facility] [phone]) [phone] Patients are seen by appointment only. Walk-ins are not accepted. [facility] will see patients 25 years of age and older. One Wednesday Evening (Monthly: Volunteer Based).  $30 per visit, cash only  [facility]  [phone]) [phone] for adults; Children under age 704, call Graduate Pediatric Dentistry at [phone]. Children aged 394-14, please call [phone] to request a pediatric application.  Dental services are provided in all areas of dental care including fillings, crowns and bridges, complete and partial dentures, implants, gum treatment, root canals, and extractions. Preventive care is also provided. Treatment is provided to both adults and children. Patients are selected via a lottery and there is often a waiting list.   [facility] [address], [place]  [phone] [email or web address]   [facility] [address] [place], [place] [phone], Ext. [phone] Second and Fourth Thursday of each month, opens at 6:30 AM; Clinic ends at 9 AM.  Patients are seen on a first-come first-served basis, and a limited number are seen during each clinic.   [facility]  [address] [place], [place] [phone]   Eligibility Requirements You must have lived in [place], [place], or [place] counties for at least the last three months.   You cannot be eligible for state or federal sponsored [place], including [employer], [place], or [employer].   You generally cannot be eligible for healthcare insurance through your employer.    How to apply: Eligibility screenings are held every Tuesday and Wednesday afternoon from 1:00 pm until 4:00 pm. You do not need an appointment for the interview!  [facility] [address], [place], [place] [phone]   [facility] Health Department  [phone]   [facility] Health Department  [phone]   [facility] Health Department  [phone]    Behavioral Health Resources in the Community: Intensive Outpatient Programs Organization         Address  Phone  Notes  [facility] Services [address] [place], [place] [phone]   [facility] Outpatient [address], [place], [place] [phone]   [facility] [address] [place], [place]  [phone]    [facility] Mental Health [address]  [place], [place] [phone] or [phone]   Substance Abuse Resources Organization         Address  Phone  Notes  [facility]  [phone]   [facility]  [phone]   [facility] [place]  [phone]   Floydene Flock  [phone]   Residential & Outpatient Substance Abuse Program  [phone]   Psychological Services Organization         Address  Phone  Notes  [facility] Behavioral Health  [phone]   [facility]  [phone]   [facility] Mental Health [address] [place] [phone] or [phone]    Mobile Crisis Teams Organization         Address  Phone  Notes  [facility], Mobile Crisis Care Unit  [phone]   [facility]  [address] [place], [place] [phone]   Doristine Locks [address] [place] [phone]    Self-Help/Support Groups Organization         Address  Phone             Notes  [facility]ental [facility]oc[facility] of [place] - variety of support groups  [phone]- I7437963 Call for more information  Narcotics Anonymous (NA), [facility] [address], [employer] [place]  2 meetings at this location   Statistician         Address  Phone  Notes  [facility] [address],    [place]  [phone]   [facility]  [address] [place] [address], [place], [place] [phone]   [facility] [address] [place], [place] [phone] Admissions: 8am-3pm M-F  [facility] [address]    [place], [place] [phone]   [facility] [address] [place], [place], [place] [phone]   [facility] [address]  [place], [place] [phone]   [facility] - Intensive Outpatient [address], [place], [place] [phone]   [facility] ([facility] [address] [place]  [place], [place] [phone] or [phone]   [facility] ([facility]) [address], [place], [place] [phone] Accepts Medicaid  [facility] [address]  [place] [phone] Substance Abuse/Addiction Treatment   [facility] Organization         Address  Phone  Notes  [facility]  [phone]   Angie Fava, PhD [address]vin Knack [place], [place]   [phone] or [phone]   [facility] Behavioral   [address] [place], [place] [phone]   [facility] [address] [place], [place] [phone] Insurance/Medicaid/sponsorship through [facility] and Families [address]                                    [place], [place] [phone] Therapy/tele-psych/case  [facility] [address], [place] [phone]    Dr. Lolly Mustache  [phone]   [facility] [place]  [employer] [facility] Dept. 1) [address], [place] 2) [address] [place] 3)  [address], [place] [phone]  [phone]   [facility] Child Abuse Hotline 978-009-4176 or [phone] (After Hours)

## 2015-01-23 ENCOUNTER — Emergency Department (HOSPITAL_COMMUNITY)
Admission: EM | Admit: 2015-01-23 | Discharge: 2015-01-23 | Disposition: A | Discharge status: Home / Self Care | Payer: Self-pay | Attending: Emergency Medicine | Admitting: Emergency Medicine

## 2015-01-23 ENCOUNTER — Encounter (HOSPITAL_COMMUNITY): Payer: Self-pay

## 2015-01-23 DIAGNOSIS — L720 Epidermal cyst: Secondary | ICD-10-CM

## 2015-01-23 DIAGNOSIS — Z3202 Encounter for pregnancy test, result negative: Secondary | ICD-10-CM | POA: Insufficient documentation

## 2015-01-23 DIAGNOSIS — A6009 Herpesviral infection of other urogenital tract: Secondary | ICD-10-CM | POA: Insufficient documentation

## 2015-01-23 DIAGNOSIS — R197 Diarrhea, unspecified: Secondary | ICD-10-CM | POA: Insufficient documentation

## 2015-01-23 DIAGNOSIS — Z91040 Latex allergy status: Secondary | ICD-10-CM | POA: Insufficient documentation

## 2015-01-23 DIAGNOSIS — N760 Acute vaginitis: Secondary | ICD-10-CM | POA: Insufficient documentation

## 2015-01-23 DIAGNOSIS — N766 Ulceration of vulva: Secondary | ICD-10-CM | POA: Insufficient documentation

## 2015-01-23 DIAGNOSIS — Z7982 Long term (current) use of aspirin: Secondary | ICD-10-CM | POA: Insufficient documentation

## 2015-01-23 DIAGNOSIS — J45909 Unspecified asthma, uncomplicated: Secondary | ICD-10-CM | POA: Insufficient documentation

## 2015-01-23 DIAGNOSIS — B9689 Other specified bacterial agents as the cause of diseases classified elsewhere: Secondary | ICD-10-CM

## 2015-01-23 DIAGNOSIS — A6000 Herpesviral infection of urogenital system, unspecified: Secondary | ICD-10-CM

## 2015-01-23 DIAGNOSIS — F17210 Nicotine dependence, cigarettes, uncomplicated: Secondary | ICD-10-CM | POA: Insufficient documentation

## 2015-01-23 HISTORY — DX: Herpesviral infection, unspecified: B00.9

## 2015-01-23 LAB — COMPREHENSIVE METABOLIC PANEL
ALBUMIN: 4.2 g/dL (ref 3.5–5.0)
ALK PHOS: 57 U/L (ref 38–126)
ALT: 12 U/L — ABNORMAL LOW (ref 14–54)
AST: 16 U/L (ref 15–41)
Anion gap: 9 (ref 5–15)
BUN: 9 mg/dL (ref 6–20)
CO2: 24 mmol/L (ref 22–32)
Calcium: 9.8 mg/dL (ref 8.9–10.3)
Chloride: 108 mmol/L (ref 101–111)
Creatinine, Ser: 0.83 mg/dL (ref 0.44–1.00)
GFR calc Af Amer: >60 mL/min (ref >60)
GFR calc non Af Amer: >60 mL/min (ref >60)
Glucose, Bld: 100 mg/dL — ABNORMAL HIGH (ref 65–99)
Potassium: 4.2 mmol/L (ref 3.5–5.1)
Sodium: 141 mmol/L (ref 135–145)
TOTAL PROTEIN: 7.6 g/dL (ref 6.5–8.1)
Total Bilirubin: 0.4 mg/dL (ref 0.3–1.2)

## 2015-01-23 LAB — CBC WITH DIFFERENTIAL/PLATELET
BASOS ABS: 0.1 10*3/uL (ref 0.0–0.1)
BASOS PCT: 1 %
Eosinophils Absolute: 0.3 10*3/uL (ref 0.0–0.7)
Eosinophils Relative: 7 %
HCT: 41.5 % (ref 36.0–46.0)
HEMOGLOBIN: 13.7 g/dL (ref 12.0–15.0)
Lymphocytes Relative: 40 %
Lymphs Abs: 1.6 10*3/uL (ref 0.7–4.0)
MCH: 30.3 pg (ref 26.0–34.0)
MCHC: 33 g/dL (ref 30.0–36.0)
MCV: 91.8 fL (ref 78.0–100.0)
MONOS PCT: 11 %
Monocytes Absolute: 0.5 10*3/uL (ref 0.1–1.0)
NEUTROS ABS: 1.6 10*3/uL — AB (ref 1.7–7.7)
NEUTROS PCT: 41 %
Platelets: 199 10*3/uL (ref 150–400)
RBC: 4.52 MIL/uL (ref 3.87–5.11)
RDW: 12.7 % (ref 11.5–15.5)
WBC: 4 10*3/uL (ref 4.0–10.5)

## 2015-01-23 LAB — WET PREP, GENITAL
Sperm: NONE SEEN
Trich, Wet Prep: NONE SEEN
Yeast Wet Prep HPF POC: NONE SEEN

## 2015-01-23 LAB — URINALYSIS, ROUTINE W REFLEX MICROSCOPIC
BILIRUBIN URINE: NEGATIVE
Glucose, UA: NEGATIVE mg/dL
KETONES UR: NEGATIVE mg/dL
Leukocytes, UA: NEGATIVE
Nitrite: NEGATIVE
PROTEIN: NEGATIVE mg/dL
Specific Gravity, Urine: 1.023 (ref 1.005–1.030)
pH: 5.5 (ref 5.0–8.0)

## 2015-01-23 LAB — URINE MICROSCOPIC-ADD ON

## 2015-01-23 LAB — I-STAT BETA HCG BLOOD, ED (MC, WL, AP ONLY)

## 2015-01-23 MED ORDER — LIDOCAINE-EPINEPHRINE (PF) 2 %-1:200000 IJ SOLN
INTRAMUSCULAR | Status: AC
  Filled 2015-01-23: qty 20

## 2015-01-23 MED ORDER — ACYCLOVIR 400 MG PO TABS: 800.0000 mg | ORAL_TABLET | Freq: Two times a day (BID) | ORAL | Status: DC

## 2015-01-23 MED ORDER — LIDOCAINE-EPINEPHRINE (PF) 2 %-1:200000 IJ SOLN
10.0000 mL | Freq: Once | INTRAMUSCULAR | Status: AC
  Administered 2015-01-23: 10 mL

## 2015-01-23 MED ORDER — METRONIDAZOLE 500 MG PO TABS: 500.0000 mg | ORAL_TABLET | Freq: Two times a day (BID) | ORAL | Status: DC

## 2015-01-23 MED ORDER — DICYCLOMINE HCL 10 MG PO CAPS
10.0000 mg | ORAL_CAPSULE | Freq: Once | ORAL | Status: AC
  Administered 2015-01-23: 10 mg via ORAL
  Filled 2015-01-23: qty 1

## 2015-01-23 NOTE — ED Notes (Signed)
Pt ambulating independently w/ steady gait on d/c in no acute distress, A&Ox4. D/c instructions reviewed w/ pt - pt denies any further questions or concerns at present. Rx given x2  

## 2015-01-23 NOTE — Discharge Instructions (Signed)
Do not drink alcohol while taking Flagyl.

## 2015-01-23 NOTE — ED Notes (Addendum)
Pt c/o increasing abscess on R buttock since September, intermittent abdominal pain and diarrhea x 1 month, and "bump" in vaginal and light vaginal bleeding starting yesterday.  Pain score 4/10.  Pt reports vaginal bleeding only w/ "wiping."  Pt is concerned because her cat had worms and thinks that she might have contracted worms.  Sts "I've noticed a stringy substance in my poop."    Also, Pt c/o irregular periods x 3 months.  Sts she has an implant, but it should have been changed x 3 years ago.

## 2015-01-23 NOTE — ED Provider Notes (Signed)
CSN: [id number]     Arrival date & time 01/23/15  0756 History   First MD Initiated Contact with Patient 01/23/15 [phone]     Chief Complaint  Patient presents with  . Abscess  . Abdominal Pain  . Diarrhea     (Consider location/radiation/quality/duration/timing/severity/associated sxs/prior Treatment) HPI Comments: Patient presents today with a complaints of abdominal pain, a "bump" on her buttocks, and a "bump" on the inside of her vagina.  She also reports that she is concerned that she contracted worms from her cat.  She reports that the "bump" on the right side of her buttocks has been present for the past four months.  She states that it has changed in color and now is a grayish colored and has become harder in consistency, but has not changed in size.  She reports that she has attempted to "pop" the area and expressed a stringy substance.  She also reports that she had diarrhea last week, but no episodes of diarrhea in the past week.  She also complains of some diffuse abdominal pain when smoking cigarettes over the past week.  Reports mild abdominal cramping at this time.  She is also complaining of a bump that she noticed inside her vagina yesterday, which is tender.  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  She states that she does have some vaginal bleeding when wiping, but only with wiping.  LMP was September of 2016.  She states that she has an Implanon present that has been in since 2010.  She denies melena or hematochezia.  She denies fever, chills, nausea, vomiting, or urinary symptoms.    Patient is a 25 y.o. female presenting with abscess, abdominal pain, and diarrhea. The history is provided by the patient.  Abscess Abdominal Pain Associated symptoms: diarrhea   Diarrhea Associated symptoms: abdominal pain     Past Medical History  Diagnosis Date  . Asthma   . Herpes    History reviewed. No pertinent past surgical  history. History reviewed. No pertinent family history. Social History  Substance Use Topics  . Smoking status: Current Every Day Smoker -- 0.50 packs/day    Types: Cigarettes  . Smokeless tobacco: None  . Alcohol Use: No   OB History    No data available     Review of Systems  Gastrointestinal: Positive for abdominal pain and diarrhea.  All other systems reviewed and are negative.     Allergies  Ibuprofen; Other; Latex; and Morphine and related  Home Medications   Prior to Admission medications   Medication Sig Start Date End Date Taking? Authorizing Provider  ASPIRIN PO Take 2 tablets by mouth 2 (two) times daily as needed (pain).   Yes Historical Provider, MD  etonogestrel (IMPLANON) 68 MG IMPL implant Inject 1 each into the skin once.   Yes Historical Provider, MD  ketorolac (TORADOL) 10 MG tablet Take 1 tablet (10 mg total) by mouth every 6 (six) hours as needed. 10/22/14   Shawn C Joy, PA-C   BP 125/67 mmHg  Pulse 58  Temp(Src) 98.1 F (36.7 C) (Oral)  Resp 14  SpO2 100% Physical Exam  Constitutional: She appears well-developed and well-nourished.  HENT:  Head: Normocephalic and atraumatic.  Mouth/Throat: Oropharynx is clear and moist.  Neck: Normal range of motion.  Cardiovascular: Normal rate, regular rhythm and normal heart sounds.   Pulmonary/Chest: Effort normal and breath sounds normal.  Abdominal: Soft. Bowel sounds are  normal. She exhibits no distension and no mass. There is no tenderness. There is no rebound and no guarding.  Genitourinary: Cervix exhibits no motion tenderness and no discharge. Right adnexum displays no mass, no tenderness and no fullness. Left adnexum displays no mass, no tenderness and no fullness.  1 cm cyst of the right lateral buttock.  No erythema, warmth, or edema of the area.  No drainage.    1 small ulceration of the right vulva  Musculoskeletal: Normal range of motion.  Neurological: She is alert.  Skin: Skin is warm and  dry.  Psychiatric: She has a normal mood and affect.  Nursing note and vitals reviewed.   ED Course  Procedures (including critical care time) Labs Review Labs Reviewed  WET PREP, GENITAL  CBC WITH DIFFERENTIAL/PLATELET  COMPREHENSIVE METABOLIC PANEL  URINALYSIS, ROUTINE W REFLEX MICROSCOPIC (NOT AT [facility])  I-STAT BETA HCG BLOOD, ED ([facility], [facility], [facility] ONLY)  GC/CHLAMYDIA PROBE AMP ([place]) NOT AT [facility]    Imaging Review No results found. I have personally reviewed and evaluated these images and lab results as part of my medical decision-making.   EKG Interpretation None     INCISION AND DRAINAGE Performed by: Santiago Glad Consent: Verbal consent obtained. Risks and benefits: risks, benefits and alternatives were discussed Type: abscess  Body area: right buttock  Anesthesia: local infiltration  Incision was made with a scalpel.  Local anesthetic: lidocaine 2% with epinephrine  Anesthetic total: 2 ml  Complexity: complex Blunt dissection to break up loculations  Drainage: none  Drainage amount: none  Patient tolerance: Patient tolerated the procedure well with no immediate complications.    MDM   Final diagnoses:  None   Patient presents today with numerous complaints.  She does have a small cystic structure on her buttock, which was incised in the ED.  No drainage.  No signs of infection.  Patient also complaining of a sore of the vaginal area.  She does have a history of Genital Herpes and lesion is consistent with a herpetic lesion.  Patient started on acyclovir to treat for an outbreak.  She is also complaining of abdominal pain.  Labs and pregnancy test are negative.  No abdominal pain on exam.  UA negative.  No CMT or adnexal tenderness with pelvic exam.  GC/Chlamydia pending.  Wet prep showing clue cells consistent with BV.  Patient started on Flagyl and instructed not to drink alcohol.  Patient stable for discharge.  Return precautions given.      Santiago Glad, PA-C 01/23/15 2233  Jerelyn Scott, MD 01/26/15 [phone]

## 2015-01-26 LAB — GC/CHLAMYDIA PROBE AMP (~~LOC~~) NOT AT ARMC
Chlamydia: NEGATIVE
NEISSERIA GONORRHEA: NEGATIVE

## 2015-04-26 ENCOUNTER — Encounter (HOSPITAL_COMMUNITY): Payer: Self-pay | Admitting: Emergency Medicine

## 2015-04-26 ENCOUNTER — Emergency Department (HOSPITAL_COMMUNITY)
Admission: EM | Admit: 2015-04-26 | Discharge: 2015-04-26 | Disposition: A | Discharge status: Home / Self Care | Payer: Medicaid Other | Attending: Emergency Medicine | Admitting: Emergency Medicine

## 2015-04-26 DIAGNOSIS — Z792 Long term (current) use of antibiotics: Secondary | ICD-10-CM | POA: Diagnosis not present

## 2015-04-26 DIAGNOSIS — K029 Dental caries, unspecified: Secondary | ICD-10-CM | POA: Insufficient documentation

## 2015-04-26 DIAGNOSIS — Z91040 Latex allergy status: Secondary | ICD-10-CM | POA: Insufficient documentation

## 2015-04-26 DIAGNOSIS — K047 Periapical abscess without sinus: Secondary | ICD-10-CM | POA: Diagnosis not present

## 2015-04-26 DIAGNOSIS — Z79899 Other long term (current) drug therapy: Secondary | ICD-10-CM | POA: Diagnosis not present

## 2015-04-26 DIAGNOSIS — J45909 Unspecified asthma, uncomplicated: Secondary | ICD-10-CM | POA: Insufficient documentation

## 2015-04-26 DIAGNOSIS — R590 Localized enlarged lymph nodes: Secondary | ICD-10-CM | POA: Insufficient documentation

## 2015-04-26 DIAGNOSIS — Z8619 Personal history of other infectious and parasitic diseases: Secondary | ICD-10-CM | POA: Diagnosis not present

## 2015-04-26 DIAGNOSIS — K0381 Cracked tooth: Secondary | ICD-10-CM | POA: Insufficient documentation

## 2015-04-26 DIAGNOSIS — R51 Headache: Secondary | ICD-10-CM | POA: Insufficient documentation

## 2015-04-26 DIAGNOSIS — F17210 Nicotine dependence, cigarettes, uncomplicated: Secondary | ICD-10-CM | POA: Diagnosis not present

## 2015-04-26 DIAGNOSIS — K0889 Other specified disorders of teeth and supporting structures: Secondary | ICD-10-CM | POA: Diagnosis present

## 2015-04-26 DIAGNOSIS — Z7982 Long term (current) use of aspirin: Secondary | ICD-10-CM | POA: Insufficient documentation

## 2015-04-26 DIAGNOSIS — S025XXA Fracture of tooth (traumatic), initial encounter for closed fracture: Secondary | ICD-10-CM

## 2015-04-26 MED ORDER — AMOXICILLIN 500 MG PO CAPS
500.0000 mg | ORAL_CAPSULE | Freq: Once | ORAL | Status: AC
  Administered 2015-04-26: 500 mg via ORAL
  Filled 2015-04-26: qty 1

## 2015-04-26 MED ORDER — HYDROCODONE-ACETAMINOPHEN 5-325 MG PO TABS
1.0000 | ORAL_TABLET | Freq: Once | ORAL | Status: AC
  Administered 2015-04-26: 1 via ORAL
  Filled 2015-04-26: qty 1

## 2015-04-26 MED ORDER — AMOXICILLIN 500 MG PO CAPS: 500.0000 mg | ORAL_CAPSULE | Freq: Three times a day (TID) | ORAL | Status: DC

## 2015-04-26 MED ORDER — HYDROCODONE-ACETAMINOPHEN 5-325 MG PO TABS
1.0000 | ORAL_TABLET | Freq: Four times a day (QID) | ORAL | Status: DC | PRN
Start: 2015-04-26 — End: 2017-04-20

## 2015-04-26 NOTE — ED Provider Notes (Signed)
CSN: [id number]     Arrival date & time 04/26/15  1608 History   None    Chief Complaint  Patient presents with  . Dental Pain     (Consider location/radiation/quality/duration/timing/severity/associated sxs/prior Treatment) Patient is a 25 y.o. female presenting with tooth pain. The history is provided by the patient. No language interpreter was used.  Dental Pain Location:  Upper Upper teeth location:  5/RU 1st bicuspid Quality:  Throbbing Severity:  Severe Onset quality:  Gradual Duration:  3 days Timing:  Constant Progression:  Worsening Chronicity:  New Context: abscess and dental caries   Relieved by:  Nothing Worsened by:  Cold food/drink and pressure Ineffective treatments:  Acetaminophen Associated symptoms: facial pain and facial swelling   Associated symptoms: no trismus   Risk factors: smoking    Tonya Gonzales is a 25 y.o. female who presents to the ED with dental pain that started 2 weeks ago after she broke a tooth but has become severe over the past 3 days. Patient does not have a dentist. She complains of facial pain and swelling.   Past Medical History  Diagnosis Date  . Asthma   . Herpes    History reviewed. No pertinent past surgical history. No family history on file. Social History  Substance Use Topics  . Smoking status: Current Every Day Smoker -- 0.50 packs/day    Types: Cigarettes  . Smokeless tobacco: None  . Alcohol Use: No   OB History    No data available     Review of Systems  HENT: Positive for dental problem and facial swelling.   all other systems negative    Allergies  Ibuprofen; Other; Latex; and Morphine and related  Home Medications   Prior to Admission medications   Medication Sig Start Date End Date Taking? Authorizing Provider  acyclovir (ZOVIRAX) 400 MG tablet Take 2 tablets (800 mg total) by mouth 2 (two) times daily. 01/23/15   Heather Laisure, PA-C  amoxicillin (AMOXIL) 500 MG capsule Take 1 capsule (500 mg  total) by mouth 3 (three) times daily. 04/26/15   [name], NP  ASPIRIN PO Take 2 tablets by mouth 2 (two) times daily as needed (pain).    Historical Provider, MD  etonogestrel (IMPLANON) 68 MG IMPL implant Inject 1 each into the skin once.    Historical Provider, MD  HYDROcodone-acetaminophen (NORCO) 5-325 MG tablet Take 1 tablet by mouth every 6 (six) hours as needed. 04/26/15   [name], NP  ketorolac (TORADOL) 10 MG tablet Take 1 tablet (10 mg total) by mouth every 6 (six) hours as needed. 10/22/14   Shawn C Joy, PA-C  metroNIDAZOLE (FLAGYL) 500 MG tablet Take 1 tablet (500 mg total) by mouth 2 (two) times daily. 01/23/15   Heather Laisure, PA-C   BP 136/92 mmHg  Pulse 124  Temp(Src) 99.1 F (37.3 C)  Resp 18  Ht 5\' 3"  (1.6 m)  SpO2 98%  LMP 04/19/2015 Physical Exam  Constitutional: She is oriented to person, place, and time. She appears well-developed and well-nourished.  HENT:  Head: Atraumatic.  Right Ear: Tympanic membrane normal.  Left Ear: Tympanic membrane normal.  Nose: Nose normal.  Mouth/Throat: Uvula is midline, oropharynx is clear and moist and mucous membranes are normal. No trismus in the jaw. Dental abscesses and dental caries present.    Multiple dental caries. Swelling of the gum surround the broken tooth tender on palpation.  Mild facial swelling on the right.   Eyes: EOM  are normal.  Neck: Normal range of motion. Neck supple.  Cardiovascular: Normal rate and regular rhythm.   Pulmonary/Chest: Effort normal and breath sounds normal.  Musculoskeletal: Normal range of motion.  Lymphadenopathy:    She has cervical adenopathy (right).  Neurological: She is alert and oriented to person, place, and time. No cranial nerve deficit.  Skin: Skin is warm and dry.  Psychiatric: She has a normal mood and affect. Her behavior is normal.  Nursing note and vitals reviewed.   ED Course  Procedures (including critical care time) Labs Review Labs Reviewed - No data  to display  Imaging   MDM  25 y.o. female with broken tooth and dental caries stable for d/c without facial erythema or concern for cellulitis. Will treat dental abscess with antibiotics and pain medication. Patient given dental referral to on call dentist and to [facility].   Final diagnoses:  Dental abscess       Janne Napoleon, NP 04/26/15 2259  Gerhard Munch, MD 04/26/15 [phone]

## 2015-04-26 NOTE — Discharge Instructions (Signed)
Do not take the narcotic if driving as it will make you sleepy.  Dental Abscess A dental abscess is a collection of pus in or around a tooth. CAUSES This condition is caused by a bacterial infection around the root of the tooth that involves the inner part of the tooth (pulp). It may result from:  Severe tooth decay.  Trauma to the tooth that allows bacteria to enter into the pulp, such as a broken or chipped tooth.  Severe gum disease around a tooth. SYMPTOMS Symptoms of this condition include:  Severe pain in and around the infected tooth.  Swelling and redness around the infected tooth, in the mouth, or in the face.  Tenderness.  Pus drainage.  Bad breath.  Bitter taste in the mouth.  Difficulty swallowing.  Difficulty opening the mouth.  Nausea.  Vomiting.  Chills.  Swollen neck glands.  Fever. DIAGNOSIS This condition is diagnosed with examination of the infected tooth. During the exam, your dentist may tap on the infected tooth. Your dentist will also ask about your medical and dental history and may order X-rays. TREATMENT This condition is treated by eliminating the infection. This may be done with:  Antibiotic medicine.  A root canal. This may be performed to save the tooth.  Pulling (extracting) the tooth. This may also involve draining the abscess. This is done if the tooth cannot be saved. HOME CARE INSTRUCTIONS  Take medicines only as directed by your dentist.  If you were prescribed antibiotic medicine, finish all of it even if you start to feel better.  Rinse your mouth (gargle) often with salt water to relieve pain or swelling.  Do not drive or operate heavy machinery while taking pain medicine.  Do not apply heat to the outside of your mouth.  Keep all follow-up visits as directed by your dentist. This is important. SEEK MEDICAL CARE IF:  Your pain is worse and is not helped by medicine. SEEK IMMEDIATE MEDICAL CARE IF:  You have a  fever or chills.  Your symptoms suddenly get worse.  You have a very bad headache.  You have problems breathing or swallowing.  You have trouble opening your mouth.  You have swelling in your neck or around your eye.   This information is not intended to replace advice given to you by your health care provider. Make sure you discuss any questions you have with your health care provider.   Document Released: 12/20/2004 Document Revised: 05/06/2014 Document Reviewed: 12/17/2013 Elsevier Interactive Patient Education 2016 [employer]  Dental Care and Dentist Visits Dental care supports good overall health. Regular dental visits can also help you avoid dental pain, bleeding, infection, and other more serious health problems in the future. It is important to keep the mouth healthy because diseases in the teeth, gums, and other oral tissues can spread to other areas of the body. Some problems, such as diabetes, heart disease, and pre-term labor have been associated with poor oral health.  See your dentist every 6 months. If you experience emergency problems such as a toothache or broken tooth, go to the dentist right away. If you see your dentist regularly, you may catch problems early. It is easier to be treated for problems in the early stages.  WHAT TO EXPECT AT A DENTIST VISIT  Your dentist will look for many common oral health problems and recommend proper treatment. At your regular dental visit, you can expect:  Gentle cleaning of the teeth and gums. This includes scraping  and polishing. This helps to remove the sticky substance around the teeth and gums (plaque). Plaque forms in the mouth shortly after eating. Over time, plaque hardens on the teeth as tartar. If tartar is not removed regularly, it can cause problems. Cleaning also helps remove stains.  Periodic X-rays. These pictures of the teeth and supporting bone will help your dentist assess the health of your teeth.  Periodic  fluoride treatments. Fluoride is a natural mineral shown to help strengthen teeth. Fluoride treatmentinvolves applying a fluoride gel or varnish to the teeth. It is most commonly done in children.  Examination of the mouth, tongue, jaws, teeth, and gums to look for any oral health problems, such as:  Cavities (dental caries). This is decay on the tooth caused by plaque, sugar, and acid in the mouth. It is best to catch a cavity when it is small.  Inflammation of the gums caused by plaque buildup (gingivitis).  Problems with the mouth or malformed or misaligned teeth.  Oral cancer or other diseases of the soft tissues or jaws. KEEP YOUR TEETH AND GUMS HEALTHY For healthy teeth and gums, follow these general guidelines as well as your dentist's specific advice:  Have your teeth professionally cleaned at the dentist every 6 months.  Brush twice daily with a fluoride toothpaste.  Floss your teeth daily.  Ask your dentist if you need fluoride supplements, treatments, or fluoride toothpaste.  Eat a healthy diet. Reduce foods and drinks with added sugar.  Avoid smoking. TREATMENT FOR ORAL HEALTH PROBLEMS If you have oral health problems, treatment varies depending on the conditions present in your teeth and gums.  Your caregiver will most likely recommend good oral hygiene at each visit.  For cavities, gingivitis, or other oral health disease, your caregiver will perform a procedure to treat the problem. This is typically done at a separate appointment. Sometimes your caregiver will refer you to another dental specialist for specific tooth problems or for surgery. SEEK IMMEDIATE DENTAL CARE IF:  You have pain, bleeding, or soreness in the gum, tooth, jaw, or mouth area.  A permanent tooth becomes loose or separated from the gum socket.  You experience a blow or injury to the mouth or jaw area.   This information is not intended to replace advice given to you by your health care  provider. Make sure you discuss any questions you have with your health care provider.   Document Released: 09/01/2010 Document Revised: 03/14/2011 Document Reviewed: 09/01/2010 Elsevier Interactive Patient Education [employer]

## 2015-04-26 NOTE — ED Notes (Signed)
Pt c/o right upper toothache x 2 weeks. Pt reports that she broke the tooth two weeks ago. Pt took medication for pain PTA that has helped.

## 2015-04-27 ENCOUNTER — Telehealth (HOSPITAL_BASED_OUTPATIENT_CLINIC_OR_DEPARTMENT_OTHER): Payer: Self-pay | Admitting: Emergency Medicine

## 2015-04-27 ENCOUNTER — Telehealth: Payer: Self-pay | Admitting: *Deleted

## 2015-04-27 NOTE — Telephone Encounter (Signed)
Pt called for Dental referral as she was referred to [place] in [place] which is not feasible for her.  [facility] [employer] [facility] [place] [facility]

## 2015-09-14 ENCOUNTER — Emergency Department (HOSPITAL_COMMUNITY)
Admission: EM | Admit: 2015-09-14 | Discharge: 2015-09-14 | Disposition: A | Discharge status: Home / Self Care | Payer: Medicaid Other | Attending: Emergency Medicine | Admitting: Emergency Medicine

## 2015-09-14 ENCOUNTER — Encounter (HOSPITAL_COMMUNITY): Payer: Self-pay

## 2015-09-14 DIAGNOSIS — Z7982 Long term (current) use of aspirin: Secondary | ICD-10-CM | POA: Diagnosis not present

## 2015-09-14 DIAGNOSIS — F17210 Nicotine dependence, cigarettes, uncomplicated: Secondary | ICD-10-CM | POA: Insufficient documentation

## 2015-09-14 DIAGNOSIS — Z91040 Latex allergy status: Secondary | ICD-10-CM | POA: Diagnosis not present

## 2015-09-14 DIAGNOSIS — J45909 Unspecified asthma, uncomplicated: Secondary | ICD-10-CM | POA: Diagnosis not present

## 2015-09-14 DIAGNOSIS — K0889 Other specified disorders of teeth and supporting structures: Secondary | ICD-10-CM | POA: Diagnosis not present

## 2015-09-14 MED ORDER — PENICILLIN V POTASSIUM 250 MG PO TABS
500.0000 mg | ORAL_TABLET | Freq: Once | ORAL | Status: AC
  Administered 2015-09-14: 500 mg via ORAL
  Filled 2015-09-14: qty 2

## 2015-09-14 MED ORDER — PENICILLIN V POTASSIUM 500 MG PO TABS: 500.0000 mg | ORAL_TABLET | Freq: Four times a day (QID) | ORAL | 0 refills | Status: AC

## 2015-09-14 MED ORDER — HYDROCODONE-ACETAMINOPHEN 5-325 MG PO TABS
1.0000 | ORAL_TABLET | Freq: Once | ORAL | Status: AC
  Administered 2015-09-14: 1 via ORAL
  Filled 2015-09-14: qty 1

## 2015-09-14 NOTE — Discharge Instructions (Signed)
You have a dental injury. It is very important that you get evaluated by a dentist as soon as possible. Call today to schedule an appointment. Take your full course of antibiotics. Read the instructions below.  Eat a soft or liquid diet and rinse your mouth out after meals with warm water. You should see a dentist or return here at once if you have increased swelling, increased pain or uncontrolled bleeding from the site of your injury.  SEEK MEDICAL CARE IF:  You have increased pain not controlled with medicines.  You have swelling around your tooth, in your face or neck.  You have bleeding which starts, continues, or gets worse.  You have a fever >101 If you are unable to open your mouth

## 2015-09-14 NOTE — ED Triage Notes (Signed)
Patient here with right upper dental pain x 2-3 days, states that she thinks she has abscess

## 2015-09-14 NOTE — ED Notes (Signed)
Pt. Stated, "I have taken penicillin before without a reaction."

## 2015-09-14 NOTE — ED Provider Notes (Signed)
[facility]-EMERGENCY DEPT Provider Note   CSN: [id number] Arrival date & time: 09/14/15  [id number]  By signing my name below, I, Aggie MoatsJenny Song, attest that this documentation has been prepared under the direction and in the presence of Beraja Healthcare CorporationJaime Ward, PA-C. Electronically signed by: Aggie MoatsJenny Song, ED Scribe. 09/14/15. 9:42 AM  History   Chief Complaint Chief Complaint  Patient presents with  . Dental Pain   The history is provided by the patient and medical records. No language interpreter was used.   HPI Comments:  Tonya Gonzales is a 25 y.o. female with PMHx of asthma who presents to the Emergency Department complaining of gradually worsening right upper dental pain, which started 2-3 days ago. Pain is characterized as a burning and itching sensation with pressure. She believes that she has an abscess due to previous history of dental abscess in same location. Pt has been treated with antibiotics previously with relief of sxs. Associated symptoms include right sided facial swelling and decreased appetite secondary to pain. She has not taken any OTC medication prior to arrival due to allergic reaction to Ibuprofen. Pt has applied a heating pad, which provides temporary relief. Denies fever, chills, changes in vision, and abnormal SOB and dyspnea. Pt smokes half a pack a day and also uses marijuana, which helps her go to sleep. She is planning on scheduling a dentist appointment.   Past Medical History:  Diagnosis Date  . Asthma   . Herpes     There are no active problems to display for this patient.   History reviewed. No pertinent surgical history.  OB History    No data available       Home Medications    Prior to Admission medications   Medication Sig Start Date End Date Taking? Authorizing Provider  acyclovir (ZOVIRAX) 400 MG tablet Take 2 tablets (800 mg total) by mouth 2 (two) times daily. 01/23/15   Heather Laisure, PA-C  amoxicillin (AMOXIL) 500 MG capsule Take 1 capsule (500 mg total)  by mouth 3 (three) times daily. 04/26/15   [name], NP  ASPIRIN PO Take 2 tablets by mouth 2 (two) times daily as needed (pain).    Historical Provider, MD  etonogestrel (IMPLANON) 68 MG IMPL implant Inject 1 each into the skin once.    Historical Provider, MD  HYDROcodone-acetaminophen (NORCO) 5-325 MG tablet Take 1 tablet by mouth every 6 (six) hours as needed. 04/26/15   [name], NP  ketorolac (TORADOL) 10 MG tablet Take 1 tablet (10 mg total) by mouth every 6 (six) hours as needed. 10/22/14   Shawn C Joy, PA-C  metroNIDAZOLE (FLAGYL) 500 MG tablet Take 1 tablet (500 mg total) by mouth 2 (two) times daily. 01/23/15   Heather Laisure, PA-C  penicillin v potassium (VEETID) 500 MG tablet Take 1 tablet (500 mg total) by mouth 4 (four) times daily. 09/14/15 09/21/15  Chase PicketJaime Pilcher Ward, PA-C    Family History No family history on file.  Social History Social History  Substance Use Topics  . Smoking status: Current Every Day Smoker    Packs/day: 0.50    Types: Cigarettes  . Smokeless tobacco: Never Used  . Alcohol use No     Allergies   Ibuprofen; Other; Latex; and Morphine and related   Review of Systems Review of Systems  Constitutional: Negative for diaphoresis and fever.  HENT: Positive for dental problem and facial swelling.   Eyes: Negative for visual disturbance.  Respiratory: Negative for shortness of breath.  Physical Exam Updated Vital Signs BP 121/70 (BP Location: Right Arm)   Pulse (!) 54   Temp 98.2 F (36.8 C) (Oral)   Resp 17   Ht 5\' 3"  (1.6 m)   Wt 70.4 kg   SpO2 99%   BMI 27.49 kg/m   Physical Exam  Constitutional: She is oriented to person, place, and time. She appears well-developed and well-nourished. No distress.  HENT:  Head: Normocephalic and atraumatic.  Mouth/Throat:    Dental cavities and poor oral dentition noted, pain along tooth as depicted in image, midline uvula, no trismus, oropharynx moist and clear, no abscess noted, no  oropharyngeal erythema or edema, neck supple and no tenderness. No facial edema.  Cardiovascular: Normal rate, regular rhythm and normal heart sounds.   Pulmonary/Chest: Effort normal and breath sounds normal. No respiratory distress. She has no wheezes. She has no rales.  Abdominal: Soft. Bowel sounds are normal. She exhibits no distension. There is no tenderness.  Musculoskeletal: She exhibits no edema.  Neurological: She is alert and oriented to person, place, and time.  Skin: Skin is warm and dry.  Nursing note and vitals reviewed.    ED Treatments / Results  DIAGNOSTIC STUDIES:  Oxygen Saturation is 99% on room air, normal by my interpretation.    COORDINATION OF CARE:  9:40 AM Discussed treatment plan with pt at bedside and pt agreed to plan.  Labs (all labs ordered are listed, but only abnormal results are displayed) Labs Reviewed - No data to display  EKG  EKG Interpretation None       Radiology No results found.  Procedures Procedures (including critical care time)  Medications Ordered in ED Medications  HYDROcodone-acetaminophen (NORCO/VICODIN) 5-325 MG per tablet 1 tablet (not administered)  penicillin v potassium (VEETID) tablet 500 mg (not administered)     Initial Impression / Assessment and Plan / ED Course  I have reviewed the triage vital signs and the nursing notes.  Pertinent labs & imaging results that were available during my care of the patient were reviewed by me and considered in my medical decision making (see chart for details).  Clinical Course   Patient with dentalgia. No abscess requiring immediate incision and drainage. Patient is afebrile, non toxic appearing, and swallowing secretions well. Exam not concerning for Ludwig's angina or pharyngeal abscess. Will treat with PenVK. I stressed the importance of dental follow up for ultimate management of dental pain and patient states that she just got insurance and will be calling to find a  dentist today. Patient voices understanding and is agreeable to plan.  Final Clinical Impressions(s) / ED Diagnoses   Final diagnoses:  Pain, dental    New Prescriptions New Prescriptions   PENICILLIN V POTASSIUM (VEETID) 500 MG TABLET    Take 1 tablet (500 mg total) by mouth 4 (four) times daily.   I personally performed the services described in this documentation, which was scribed in my presence. The recorded information has been reviewed and is accurate.   [facility] Ward, PA-C 09/14/15 1003    Donnetta Hutching, MD 09/15/15 [phone]

## 2015-09-14 NOTE — ED Notes (Signed)
Warm pack given to pt. To apply to her rt. Cheek for pain controll

## 2016-01-25 ENCOUNTER — Emergency Department (HOSPITAL_COMMUNITY)
Admission: EM | Admit: 2016-01-25 | Discharge: 2016-01-25 | Disposition: A | Discharge status: Home / Self Care | Payer: Medicaid Other | Attending: Emergency Medicine | Admitting: Emergency Medicine

## 2016-01-25 ENCOUNTER — Encounter (HOSPITAL_COMMUNITY): Payer: Self-pay | Admitting: Emergency Medicine

## 2016-01-25 DIAGNOSIS — K047 Periapical abscess without sinus: Secondary | ICD-10-CM | POA: Diagnosis not present

## 2016-01-25 DIAGNOSIS — K0889 Other specified disorders of teeth and supporting structures: Secondary | ICD-10-CM | POA: Diagnosis present

## 2016-01-25 DIAGNOSIS — F17210 Nicotine dependence, cigarettes, uncomplicated: Secondary | ICD-10-CM | POA: Diagnosis not present

## 2016-01-25 DIAGNOSIS — Z79899 Other long term (current) drug therapy: Secondary | ICD-10-CM | POA: Insufficient documentation

## 2016-01-25 DIAGNOSIS — J45909 Unspecified asthma, uncomplicated: Secondary | ICD-10-CM | POA: Diagnosis not present

## 2016-01-25 DIAGNOSIS — Z91040 Latex allergy status: Secondary | ICD-10-CM | POA: Insufficient documentation

## 2016-01-25 DIAGNOSIS — Z7982 Long term (current) use of aspirin: Secondary | ICD-10-CM | POA: Insufficient documentation

## 2016-01-25 MED ORDER — PENICILLIN V POTASSIUM 500 MG PO TABS: 500.0000 mg | ORAL_TABLET | Freq: Four times a day (QID) | ORAL | 0 refills | Status: DC

## 2016-01-25 MED ORDER — ACETAMINOPHEN-CODEINE #3 300-30 MG PO TABS: 1.0000 | ORAL_TABLET | Freq: Four times a day (QID) | ORAL | 0 refills | Status: DC | PRN

## 2016-01-25 NOTE — ED Provider Notes (Signed)
[facility]-EMERGENCY DEPT Provider Note   CSN: [id number] Arrival date & time: 01/25/16  1407  By signing my name below, I, Tonya Gonzales, attest that this documentation has been prepared under the direction and in the presence of  [facility] HospitalEmily Koby Hartfield, PA-C. Electronically Signed: Clovis PuAvnee Gonzales, ED Scribe. 01/25/16. 3:10 PM.   History   Chief Complaint Chief Complaint  Patient presents with  . Dental Pain   The history is provided by the patient. No language interpreter was used.   HPI Comments:  Tonya Gonzales is a 26 y.o. female who presents to the Emergency Department complaining of persistent upper right dental pain x 1 week. Pt also reports a lump to the area x 2 days and facial swelling. She has applied BC to the area with no significant relief. Pt denies fevers, chills, body aches, sore throat, difficulty swallowing and any other associated symptoms. Pt is not followed by a dentist. No other complaints noted.    Past Medical History:  Diagnosis Date  . Asthma   . Herpes     There are no active problems to display for this patient.   History reviewed. No pertinent surgical history.  OB History    No data available       Home Medications    Prior to Admission medications   Medication Sig Start Date End Date Taking? Authorizing Provider  acetaminophen-codeine (TYLENOL #3) 300-30 MG tablet Take 1-2 tablets by mouth every 6 (six) hours as needed for moderate pain. 01/25/16   Tonya DredgeEmily Reiley Keisler, PA-C  acyclovir (ZOVIRAX) 400 MG tablet Take 2 tablets (800 mg total) by mouth 2 (two) times daily. 01/23/15   Heather Laisure, PA-C  amoxicillin (AMOXIL) 500 MG capsule Take 1 capsule (500 mg total) by mouth 3 (three) times daily. 04/26/15   Tonya Orlene OchM Neese, NP  ASPIRIN PO Take 2 tablets by mouth 2 (two) times daily as needed (pain).    Historical Provider, MD  etonogestrel (IMPLANON) 68 MG IMPL implant Inject 1 each into the skin once.    Historical Provider, MD  HYDROcodone-acetaminophen (NORCO) 5-325 MG  tablet Take 1 tablet by mouth every 6 (six) hours as needed. 04/26/15   Tonya Orlene OchM Neese, NP  ketorolac (TORADOL) 10 MG tablet Take 1 tablet (10 mg total) by mouth every 6 (six) hours as needed. 10/22/14   Tonya C Joy, PA-C  metroNIDAZOLE (FLAGYL) 500 MG tablet Take 1 tablet (500 mg total) by mouth 2 (two) times daily. 01/23/15   Heather Laisure, PA-C  penicillin v potassium (VEETID) 500 MG tablet Take 1 tablet (500 mg total) by mouth 4 (four) times daily. 01/25/16   Tonya DredgeEmily Elizzie Westergard, PA-C    Family History No family history on file.  Social History Social History  Substance Use Topics  . Smoking status: Current Every Day Smoker    Packs/day: 0.50    Types: Cigarettes  . Smokeless tobacco: Never Used  . Alcohol use No     Allergies   Ibuprofen; Other; Latex; and Morphine and related   Review of Systems Review of Systems  Constitutional: Negative for chills and fever.  HENT: Positive for dental problem and facial swelling. Negative for sore throat and trouble swallowing.   Respiratory: Negative for shortness of breath and stridor.   Musculoskeletal: Negative for myalgias.  Skin: Negative for color change and wound.  Allergic/Immunologic: Negative for immunocompromised state.  Psychiatric/Behavioral: Negative for self-injury.     Physical Exam Updated Vital Signs BP 117/72 (BP Location: Left Arm)  Pulse 65   Temp 98.2 F (36.8 C) (Oral)   Resp 16   LMP 01/23/2016   SpO2 99%   Physical Exam  Constitutional: She appears well-developed and well-nourished. No distress.  HENT:  Head: Normocephalic and atraumatic.  Mouth/Throat: Uvula is midline and oropharynx is clear and moist. Mucous membranes are not dry. No uvula swelling. No oropharyngeal exudate, posterior oropharyngeal edema, posterior oropharyngeal erythema or tonsillar abscesses.  Right upper second bicuspid with visible abscess at the superior aspect of the gingiva. Mild associated localized facial swelling. No overlying  skin changes or cellulitis.  Significant decay in all right upper teeth.    Neck: Trachea normal, normal range of motion and phonation normal. Neck supple. No tracheal tenderness present. No neck rigidity. No tracheal deviation, no edema, no erythema and normal range of motion present.  Cardiovascular: Normal rate.   Pulmonary/Chest: Effort normal and breath sounds normal. No stridor.  Lymphadenopathy:    She has no cervical adenopathy.  Neurological: She is alert.  Skin: She is not diaphoretic.  Nursing note and vitals reviewed.    ED Treatments / Results  DIAGNOSTIC STUDIES:  Oxygen Saturation is 99% on RA, normal by my interpretation.    COORDINATION OF CARE:  3:09 PM Discussed treatment plan with pt at bedside and pt agreed to plan.  Labs (all labs ordered are listed, but only abnormal results are displayed) Labs Reviewed - No data to display  EKG  EKG Interpretation None       Radiology No results found.  Procedures Procedures (including critical care time)  Medications Ordered in ED Medications - No data to display   Initial Impression / Assessment and Plan / ED Course  I have reviewed the triage vital signs and the nursing notes.  Pertinent labs & imaging results that were available during my care of the patient were reviewed by me and considered in my medical decision making (see chart for details).  Clinical Course as of Jan 24 1601  Mon Jan 25, 2016  1511 Pt checked on [facility] DEA database.  No recent prescriptions.    [EW]    Clinical Course User Index [EW] Tonya Dredge, PA-C    Patient with dentalgia with associated abscess. Exam not concerning for Ludwig's angina or pharyngeal abscess.  Will treat with penicillin. Pt instructed to follow-up with dentist.  No dentist or oral surgeon on call, will give dental resource guide. Discussed return precautions. Pt safe for discharge.  Discussed result, findings, treatment, and follow up  with patient.  Pt given  return precautions.  Pt verbalizes understanding and agrees with plan.      Final Clinical Impressions(s) / ED Diagnoses   Final diagnoses:  Dental abscess    New Prescriptions New Prescriptions   ACETAMINOPHEN-CODEINE (TYLENOL #3) 300-30 MG TABLET    Take 1-2 tablets by mouth every 6 (six) hours as needed for moderate pain.   PENICILLIN V POTASSIUM (VEETID) 500 MG TABLET    Take 1 tablet (500 mg total) by mouth 4 (four) times daily.    I personally performed the services described in this documentation, which was scribed in my presence. The recorded information has been reviewed and is accurate.     Tonya Dredge, PA-C 01/25/16 1606    Derwood Kaplan, MD 01/26/16 1536

## 2016-01-25 NOTE — ED Triage Notes (Signed)
Pt complaint of right upper tooth pain/possible abscess onset last week.

## 2016-01-25 NOTE — Discharge Instructions (Signed)
Read the information below.  Use the prescribed medication as directed.  Please discuss all new medications with your pharmacist.  You may return to the Emergency Department at any time for worsening condition or any new symptoms that concern you.   Please call the dentist listed above within 48 hours to schedule a close follow up appointment.  If you develop fevers, swelling in your face, difficulty swallowing or breathing, return to the ER immediately for a recheck.   °

## 2016-06-06 ENCOUNTER — Encounter (HOSPITAL_COMMUNITY): Payer: Self-pay | Admitting: *Deleted

## 2016-06-06 ENCOUNTER — Emergency Department (HOSPITAL_COMMUNITY)
Admission: EM | Admit: 2016-06-06 | Discharge: 2016-06-06 | Disposition: A | Discharge status: Home / Self Care | Payer: Medicaid Other | Attending: Emergency Medicine | Admitting: Emergency Medicine

## 2016-06-06 DIAGNOSIS — F17210 Nicotine dependence, cigarettes, uncomplicated: Secondary | ICD-10-CM | POA: Diagnosis not present

## 2016-06-06 DIAGNOSIS — K0889 Other specified disorders of teeth and supporting structures: Secondary | ICD-10-CM | POA: Diagnosis present

## 2016-06-06 DIAGNOSIS — Z7982 Long term (current) use of aspirin: Secondary | ICD-10-CM | POA: Insufficient documentation

## 2016-06-06 DIAGNOSIS — J45909 Unspecified asthma, uncomplicated: Secondary | ICD-10-CM | POA: Insufficient documentation

## 2016-06-06 DIAGNOSIS — Z91040 Latex allergy status: Secondary | ICD-10-CM | POA: Diagnosis not present

## 2016-06-06 DIAGNOSIS — Z79899 Other long term (current) drug therapy: Secondary | ICD-10-CM | POA: Insufficient documentation

## 2016-06-06 DIAGNOSIS — K029 Dental caries, unspecified: Secondary | ICD-10-CM | POA: Diagnosis not present

## 2016-06-06 MED ORDER — ACETAMINOPHEN 500 MG PO TABS: 1000.0000 mg | ORAL_TABLET | Freq: Three times a day (TID) | ORAL | 0 refills | Status: DC | PRN

## 2016-06-06 MED ORDER — PENICILLIN V POTASSIUM 500 MG PO TABS: 500.0000 mg | ORAL_TABLET | Freq: Four times a day (QID) | ORAL | 0 refills | Status: AC

## 2016-06-06 MED ORDER — HYDROCODONE-ACETAMINOPHEN 5-325 MG PO TABS
1.0000 | ORAL_TABLET | Freq: Once | ORAL | Status: AC
  Administered 2016-06-06: 1 via ORAL
  Filled 2016-06-06: qty 1

## 2016-06-06 NOTE — ED Triage Notes (Signed)
Pt reports right side upper and lower dental pain. No swelling noted. Airway intact.

## 2016-06-06 NOTE — Discharge Instructions (Signed)
Take antibiotic 4 times a day for one week Take Tylenol 1000mg  three times a day Follow up with dentist for ongoing care

## 2016-06-06 NOTE — ED Provider Notes (Signed)
[facility]-EMERGENCY DEPT Provider Note   CSN: [id number] Arrival date & time: 06/06/16  [id number]  By signing my name below, I, Thelma Bargeick Cochran, attest that this documentation has been prepared under the direction and in the presence of Terance HartKelly Tannia Contino, PA-C. Electronically Signed: Thelma BargeNick Cochran, Scribe. 06/06/16. 9:47 AM.  History   Chief Complaint Chief Complaint  Patient presents with  . Dental Pain   The history is provided by the patient. No language interpreter was used.   HPI Comments: Tonya Gonzales is a 26 y.o. female with partial upper dentures who presents to the Emergency Department complaining of constant, gradually worsening right-sided upper dental pain that began last week. She has associated swelling on the right side of her face. She has tried using BC powder and brushing her teeth rigorously with no relief. Pt has a broken tooth with discharge but this area does not hurt at the moment. She notes she was sick last week with an upper respiratory infection, but currently does not have any symptoms. She denies fever or drainage to the affected area. Pt does not have a dentist. Pt has allergies to ibuprofen and morphine.  Past Medical History:  Diagnosis Date  . Asthma   . Herpes     There are no active problems to display for this patient.   History reviewed. No pertinent surgical history.  OB History    No data available       Home Medications    Prior to Admission medications   Medication Sig Start Date End Date Taking? Authorizing Provider  acetaminophen-codeine (TYLENOL #3) 300-30 MG tablet Take 1-2 tablets by mouth every 6 (six) hours as needed for moderate pain. 01/25/16   Trixie DredgeWest, Emily, PA-C  acyclovir (ZOVIRAX) 400 MG tablet Take 2 tablets (800 mg total) by mouth 2 (two) times daily. 01/23/15   Santiago GladLaisure, Heather, PA-C  amoxicillin (AMOXIL) 500 MG capsule Take 1 capsule (500 mg total) by mouth 3 (three) times daily. 04/26/15   Janne NapoleonNeese, Hope M, NP  ASPIRIN PO Take 2 tablets by  mouth 2 (two) times daily as needed (pain).    [provider]  etonogestrel (IMPLANON) 68 MG IMPL implant Inject 1 each into the skin once.    [provider]  HYDROcodone-acetaminophen (NORCO) 5-325 MG tablet Take 1 tablet by mouth every 6 (six) hours as needed. 04/26/15   Janne NapoleonNeese, Hope M, NP  ketorolac (TORADOL) 10 MG tablet Take 1 tablet (10 mg total) by mouth every 6 (six) hours as needed. 10/22/14   Joy, Shawn C, PA-C  metroNIDAZOLE (FLAGYL) 500 MG tablet Take 1 tablet (500 mg total) by mouth 2 (two) times daily. 01/23/15   Santiago GladLaisure, Heather, PA-C  penicillin v potassium (VEETID) 500 MG tablet Take 1 tablet (500 mg total) by mouth 4 (four) times daily. 01/25/16   Trixie DredgeWest, Emily, PA-C    Family History History reviewed. No pertinent family history.  Social History Social History  Substance Use Topics  . Smoking status: Current Every Day Smoker    Packs/day: 0.50    Types: Cigarettes  . Smokeless tobacco: Never Used  . Alcohol use No     Allergies   Ibuprofen; Other; Latex; and Morphine and related   Review of Systems Review of Systems  Constitutional: Negative for fever.  HENT: Positive for dental problem and facial swelling.   All other systems reviewed and are negative.    Physical Exam Updated Vital Signs BP 127/83 (BP Location: Right Arm)   Pulse 85  Temp 98 F (36.7 C) (Oral)   Resp 18   LMP 05/18/2016   SpO2 100%   Physical Exam  Constitutional: She is oriented to person, place, and time. She appears well-developed and well-nourished. No distress.  HENT:  Head: Normocephalic and atraumatic.  Mouth/Throat: She has dentures. No trismus in the jaw. Abnormal dentition. Dental caries present. No dental abscesses.  Wide spread decay Partial upper dentures in place No drainable abscess   Eyes: Conjunctivae are normal. Pupils are equal, round, and reactive to light. Right eye exhibits no discharge. Left eye exhibits no discharge. No scleral icterus.    Neck: Normal range of motion.  Cardiovascular: Normal rate.   Pulmonary/Chest: Effort normal. No respiratory distress.  Abdominal: She exhibits no distension.  Neurological: She is alert and oriented to person, place, and time.  Skin: Skin is warm and dry.  Psychiatric: She has a normal mood and affect. Her behavior is normal.  Nursing note and vitals reviewed.    ED Treatments / Results  DIAGNOSTIC STUDIES: Oxygen Saturation is 100% on RA, normal by my interpretation.    COORDINATION OF CARE: 9:42 AM Discussed treatment plan with pt at bedside and pt agreed to plan.  Labs (all labs ordered are listed, but only abnormal results are displayed) Labs Reviewed - No data to display  EKG  EKG Interpretation None       Radiology No results found.  Procedures Procedures (including critical care time)  Medications Ordered in ED Medications - No data to display   Initial Impression / Assessment and Plan / ED Course  I have reviewed the triage vital signs and the nursing notes.  Pertinent labs & imaging results that were available during my care of the patient were reviewed by me and considered in my medical decision making (see chart for details).  26 year old female with dental pain associated with dental caries and possible dental infection. Patient is afebrile, non toxic appearing, and swallowing secretions well. I gave patient referral to dentist and stressed the importance of dental follow up for ultimate management of dental pain. I have also discussed reasons to return immediately to the ER.  Patient expresses understanding and agrees with plan. Gave her a dose of Norco in the ED but I do not think it is appropriate to send her home with any stronger medicine since she frequently presents for this problem. She apparently has anaphylaxis with Ibuprofen (although it looks like she's been prescribed Toradol in the past without any issue). Advised Tylenol and PCN and dental  follow up.   Final Clinical Impressions(s) / ED Diagnoses   Final diagnoses:  Pain due to dental caries    New Prescriptions Discharge Medication List as of 06/06/2016  9:48 AM    START taking these medications   Details  acetaminophen (TYLENOL) 500 MG tablet Take 2 tablets (1,000 mg total) by mouth every 8 (eight) hours as needed., Starting Mon 06/06/2016, Print       I personally performed the services described in this documentation, which was scribed in my presence. The recorded information has been reviewed and is accurate.     Bethel Born, PA-C 06/08/16 1310    Bethel Born, PA-C 06/08/16 1311    Margarita Grizzle, MD 06/11/16 1539

## 2017-04-20 ENCOUNTER — Encounter: Payer: Self-pay | Admitting: Physician Assistant

## 2017-04-20 ENCOUNTER — Encounter (INDEPENDENT_AMBULATORY_CARE_PROVIDER_SITE_OTHER): Payer: Self-pay | Admitting: Nurse Practitioner

## 2017-04-20 ENCOUNTER — Ambulatory Visit (INDEPENDENT_AMBULATORY_CARE_PROVIDER_SITE_OTHER): Payer: Medicaid Other | Admitting: Nurse Practitioner

## 2017-04-20 VITALS — BP 114/78 | HR 79 | Temp 99.1°F | Resp 12 | Ht 62.0 in | Wt 152.4 lb

## 2017-04-20 DIAGNOSIS — Z3046 Encounter for surveillance of implantable subdermal contraceptive: Secondary | ICD-10-CM | POA: Diagnosis not present

## 2017-04-20 DIAGNOSIS — Z8719 Personal history of other diseases of the digestive system: Secondary | ICD-10-CM

## 2017-04-20 DIAGNOSIS — J45909 Unspecified asthma, uncomplicated: Secondary | ICD-10-CM

## 2017-04-20 MED ORDER — ALBUTEROL SULFATE HFA 108 (90 BASE) MCG/ACT IN AERS: 2.0000 | INHALATION_SPRAY | Freq: Four times a day (QID) | RESPIRATORY_TRACT | 0 refills | Status: DC | PRN

## 2017-04-20 NOTE — Progress Notes (Signed)
Assessment & Plan:  Tonya Gonzales was seen today for irritable bowel syndrome and asthma.  Diagnoses and all orders for this visit:  History of IBS -     Ambulatory referral to Gastroenterology -     CBC -     Basic metabolic panel -     Lipid panel  Nexplanon removal -     Ambulatory referral to Gynecology  Uncomplicated asthma, unspecified asthma severity, unspecified whether persistent -     albuterol (PROVENTIL HFA;VENTOLIN HFA) 108 (90 Base) MCG/ACT inhaler; Inhale 2 puffs into the lungs every 6 (six) hours as needed for wheezing or shortness of breath.    Patient has been counseled on age-appropriate routine health concerns for screening and prevention. These are reviewed and up-to-date. Referrals have been placed accordingly. Immunizations are up-to-date or declined.    Subjective:   Chief Complaint  Patient presents with  . Irritable Bowel Syndrome  . Asthma   HPI Tonya Gonzales 27 y.o. female presents to office today to establish care. She is accompanied by her mother.  Her nexplanon is due for removal. She reports it was implanted 5 years ago. I will refer her to GYN for removal as implants are not removed in this clinic.   Inflammatory Bowel Disease Patient here for evaluation of IBS symptoms. Diagnosis was made by "someone in the emergency room a long time ago". Onset was 4 years ago. The patient has had no surgery for treatment of their IBD. The patient is currently having numerous bowel movements per day which alternate between diarrhea and constipation along with abdominal pain. The patient has not had any weight loss.  She also endorses mucous in her stools along with vomiting. She smokes marijuana to stimulate her appetite. She has been on a medication in the past for her IBS however she reports it made her sleepy so she stopped taking it after a week. She can not recall the name of the medication. She needs a gastroenterology referral.  She also has a history of PID. She is  also overdue for PAP. Will schedule PAP for next visit.   Asthma Childhood onset. Stable. Needs refill of albuterol inhaler.   Review of Systems  Constitutional: Negative for fever, malaise/fatigue and weight loss.  Eyes: Negative.  Negative for blurred vision, double vision and photophobia.  Respiratory: Negative.  Negative for cough and shortness of breath.   Cardiovascular: Negative.  Negative for chest pain, palpitations and leg swelling.  Gastrointestinal: Positive for abdominal pain, blood in stool, constipation, diarrhea, nausea and vomiting. Negative for heartburn and melena.  Genitourinary: Negative.   Musculoskeletal: Negative.  Negative for myalgias.  Neurological: Negative.  Negative for dizziness, focal weakness, seizures and headaches.  Psychiatric/Behavioral: Negative.  Negative for suicidal ideas.    Past Medical History:  Diagnosis Date  . Asthma   . Herpes     No past surgical history on file.  Family History  Problem Relation Age of Onset  . Hypertension Mother   . Diabetes Mother   . Diabetes Maternal Grandmother     Social History Reviewed with no changes to be made today.   Outpatient Medications Prior to Visit  Medication Sig Dispense Refill  . ASPIRIN PO Take 2 tablets by mouth 2 (two) times daily as needed (pain).    Marland Kitchen etonogestrel (IMPLANON) 68 MG IMPL implant Inject 1 each into the skin once.    Marland Kitchen acetaminophen (TYLENOL) 500 MG tablet Take 2 tablets (1,000 mg total) by mouth  every 8 (eight) hours as needed. (Patient not taking: Reported on 04/20/2017) 30 tablet 0  . acetaminophen-codeine (TYLENOL #3) 300-30 MG tablet Take 1-2 tablets by mouth every 6 (six) hours as needed for moderate pain. (Patient not taking: Reported on 04/20/2017) 15 tablet 0  . acyclovir (ZOVIRAX) 400 MG tablet Take 2 tablets (800 mg total) by mouth 2 (two) times daily. (Patient not taking: Reported on 04/20/2017) 20 tablet 0  . amoxicillin (AMOXIL) 500 MG capsule Take 1 capsule  (500 mg total) by mouth 3 (three) times daily. (Patient not taking: Reported on 04/20/2017) 30 capsule 0  . HYDROcodone-acetaminophen (NORCO) 5-325 MG tablet Take 1 tablet by mouth every 6 (six) hours as needed. (Patient not taking: Reported on 04/20/2017) 15 tablet 0  . ketorolac (TORADOL) 10 MG tablet Take 1 tablet (10 mg total) by mouth every 6 (six) hours as needed. (Patient not taking: Reported on 04/20/2017) 20 tablet 0  . metroNIDAZOLE (FLAGYL) 500 MG tablet Take 1 tablet (500 mg total) by mouth 2 (two) times daily. (Patient not taking: Reported on 04/20/2017) 14 tablet 0   No facility-administered medications prior to visit.     Allergies  Allergen Reactions  . Ibuprofen Anaphylaxis    Sore throat associated.   . Other Anaphylaxis    All fruits cause sore throat   . Latex Hives and Rash    "burns skin"  . Morphine And Related Hives and Rash       Objective:    BP 114/78 (BP Location: Left Arm, Patient Position: Sitting, Cuff Size: Large)   Pulse 79   Temp 99.1 F (37.3 C)   Resp 12   Ht 5\' 2"  (1.575 m)   Wt 152 lb 6.4 oz (69.1 kg)   LMP 04/19/2017   SpO2 99%   BMI 27.87 kg/m  Wt Readings from Last 3 Encounters:  04/20/17 152 lb 6.4 oz (69.1 kg)  09/14/15 155 lb 3.2 oz (70.4 kg)  06/27/13 138 lb 14.4 oz (63 kg)    Physical Exam  Constitutional: She is oriented to person, place, and time. She appears well-developed and well-nourished. She is cooperative.  HENT:  Head: Normocephalic and atraumatic.  Eyes: EOM are normal.  Neck: Normal range of motion.  Cardiovascular: Normal rate, regular rhythm and normal heart sounds. Exam reveals no gallop and no friction rub.  No murmur heard. Pulmonary/Chest: Effort normal and breath sounds normal. No tachypnea. No respiratory distress. She has no decreased breath sounds. She has no wheezes. She has no rhonchi. She has no rales. She exhibits no tenderness.  Abdominal: Soft. Bowel sounds are normal. She exhibits no mass. There is  no tenderness. There is no rebound and no guarding.  Musculoskeletal: Normal range of motion. She exhibits no edema.  Neurological: She is alert and oriented to person, place, and time. Coordination normal.  Skin: Skin is warm and dry.  Psychiatric: She has a normal mood and affect. Her behavior is normal. Judgment and thought content normal.  Nursing note and vitals reviewed.      Patient has been counseled extensively about nutrition and exercise as well as the importance of adherence with medications and regular follow-up. The patient was given clear instructions to go to ER or return to medical center if symptoms don't improve, worsen or new problems develop. The patient verbalized understanding.   Follow-up: Return for Physical and PAP.   Claiborne RiggZelda W Ayeza Therriault, FNP-BC [facility] [place], [place] [phone]   04/23/2017,  10:18 PM

## 2017-04-20 NOTE — Patient Instructions (Addendum)
Health Maintenance, Female Adopting a healthy lifestyle and getting preventive care can go a long way to promote health and wellness. Talk with your health care provider about what schedule of regular examinations is right for you. This is a good chance for you to check in with your provider about disease prevention and staying healthy. In between checkups, there are plenty of things you can do on your own. Experts have done a lot of research about which lifestyle changes and preventive measures are most likely to keep you healthy. Ask your health care provider for more information. Weight and diet Eat a healthy diet  Be sure to include plenty of vegetables, fruits, low-fat dairy products, and lean protein.  Do not eat a lot of foods high in solid fats, added sugars, or salt.  Get regular exercise. This is one of the most important things you can do for your health. ? Most adults should exercise for at least 150 minutes each week. The exercise should increase your heart rate and make you sweat (moderate-intensity exercise). ? Most adults should also do strengthening exercises at least twice a week. This is in addition to the moderate-intensity exercise.  Maintain a healthy weight  Body mass index (BMI) is a measurement that can be used to identify possible weight problems. It estimates body fat based on height and weight. Your health care provider can help determine your BMI and help you achieve or maintain a healthy weight.  For females 20 years of age and older: ? A BMI below 18.5 is considered underweight. ? A BMI of 18.5 to 24.9 is normal. ? A BMI of 25 to 29.9 is considered overweight. ? A BMI of 30 and above is considered obese.  Watch levels of cholesterol and blood lipids  You should start having your blood tested for lipids and cholesterol at 28 years of age, then have this test every 5 years.  You may need to have your cholesterol levels checked more often if: ? Your lipid or  cholesterol levels are high. ? You are older than 28 years of age. ? You are at high risk for heart disease.  Cancer screening Lung Cancer  Lung cancer screening is recommended for adults 55-80 years old who are at high risk for lung cancer because of a history of smoking.  A yearly low-dose CT scan of the lungs is recommended for people who: ? Currently smoke. ? Have quit within the past 15 years. ? Have at least a 30-pack-year history of smoking. A pack year is smoking an average of one pack of cigarettes a day for 1 year.  Yearly screening should continue until it has been 15 years since you quit.  Yearly screening should stop if you develop a health problem that would prevent you from having lung cancer treatment.  Breast Cancer  Practice breast self-awareness. This means understanding how your breasts normally appear and feel.  It also means doing regular breast self-exams. Let your health care provider know about any changes, no matter how small.  If you are in your 20s or 30s, you should have a clinical breast exam (CBE) by a health care provider every 1-3 years as part of a regular health exam.  If you are 40 or older, have a CBE every year. Also consider having a breast X-ray (mammogram) every year.  If you have a family history of breast cancer, talk to your health care provider about genetic screening.  If you are at high risk   for breast cancer, talk to your health care provider about having an MRI and a mammogram every year.  Breast cancer gene (BRCA) assessment is recommended for women who have family members with BRCA-related cancers. BRCA-related cancers include: ? Breast. ? Ovarian. ? Tubal. ? Peritoneal cancers.  Results of the assessment will determine the need for genetic counseling and BRCA1 and BRCA2 testing.  Cervical Cancer Your health care provider may recommend that you be screened regularly for cancer of the pelvic organs (ovaries, uterus, and  vagina). This screening involves a pelvic examination, including checking for microscopic changes to the surface of your cervix (Pap test). You may be encouraged to have this screening done every 3 years, beginning at age 22.  For women ages 56-65, health care providers may recommend pelvic exams and Pap testing every 3 years, or they may recommend the Pap and pelvic exam, combined with testing for human papilloma virus (HPV), every 5 years. Some types of HPV increase your risk of cervical cancer. Testing for HPV may also be done on women of any age with unclear Pap test results.  Other health care providers may not recommend any screening for nonpregnant women who are considered low risk for pelvic cancer and who do not have symptoms. Ask your health care provider if a screening pelvic exam is right for you.  If you have had past treatment for cervical cancer or a condition that could lead to cancer, you need Pap tests and screening for cancer for at least 20 years after your treatment. If Pap tests have been discontinued, your risk factors (such as having a new sexual partner) need to be reassessed to determine if screening should resume. Some women have medical problems that increase the chance of getting cervical cancer. In these cases, your health care provider may recommend more frequent screening and Pap tests.  Colorectal Cancer  This type of cancer can be detected and often prevented.  Routine colorectal cancer screening usually begins at 28 years of age and continues through 28 years of age.  Your health care provider may recommend screening at an earlier age if you have risk factors for colon cancer.  Your health care provider may also recommend using home test kits to check for hidden blood in the stool.  A small camera at the end of a tube can be used to examine your colon directly (sigmoidoscopy or colonoscopy). This is done to check for the earliest forms of colorectal  cancer.  Routine screening usually begins at age 33.  Direct examination of the colon should be repeated every 5-10 years through 28 years of age. However, you may need to be screened more often if early forms of precancerous polyps or small growths are found.  Skin Cancer  Check your skin from head to toe regularly.  Tell your health care provider about any new moles or changes in moles, especially if there is a change in a mole's shape or color.  Also tell your health care provider if you have a mole that is larger than the size of a pencil eraser.  Always use sunscreen. Apply sunscreen liberally and repeatedly throughout the day.  Protect yourself by wearing long sleeves, pants, a wide-brimmed hat, and sunglasses whenever you are outside.  Heart disease, diabetes, and high blood pressure  High blood pressure causes heart disease and increases the risk of stroke. High blood pressure is more likely to develop in: ? People who have blood pressure in the high end of  the normal range (130-139/85-89 mm Hg). ? People who are overweight or obese. ? People who are African American.  If you are 21-29 years of age, have your blood pressure checked every 3-5 years. If you are 3 years of age or older, have your blood pressure checked every year. You should have your blood pressure measured twice-once when you are at a hospital or clinic, and once when you are not at a hospital or clinic. Record the average of the two measurements. To check your blood pressure when you are not at a hospital or clinic, you can use: ? An automated blood pressure machine at a pharmacy. ? A home blood pressure monitor.  If you are between 17 years and 37 years old, ask your health care provider if you should take aspirin to prevent strokes.  Have regular diabetes screenings. This involves taking a blood sample to check your fasting blood sugar level. ? If you are at a normal weight and have a low risk for diabetes,  have this test once every three years after 28 years of age. ? If you are overweight and have a high risk for diabetes, consider being tested at a younger age or more often. Preventing infection Hepatitis B  If you have a higher risk for hepatitis B, you should be screened for this virus. You are considered at high risk for hepatitis B if: ? You were born in a country where hepatitis B is common. Ask your health care provider which countries are considered high risk. ? Your parents were born in a high-risk country, and you have not been immunized against hepatitis B (hepatitis B vaccine). ? You have HIV or AIDS. ? You use needles to inject street drugs. ? You live with someone who has hepatitis B. ? You have had sex with someone who has hepatitis B. ? You get hemodialysis treatment. ? You take certain medicines for conditions, including cancer, organ transplantation, and autoimmune conditions.  Hepatitis C  Blood testing is recommended for: ? Everyone born from [birth year] through 1966. ? Anyone with known risk factors for hepatitis C.  Sexually transmitted infections (STIs)  You should be screened for sexually transmitted infections (STIs) including gonorrhea and chlamydia if: ? You are sexually active and are younger than 28 years of age. ? You are older than 28 years of age and your health care provider tells you that you are at risk for this type of infection. ? Your sexual activity has changed since you were last screened and you are at an increased risk for chlamydia or gonorrhea. Ask your health care provider if you are at risk.  If you do not have HIV, but are at risk, it may be recommended that you take a prescription medicine daily to prevent HIV infection. This is called pre-exposure prophylaxis (PrEP). You are considered at risk if: ? You are sexually active and do not regularly use condoms or know the HIV status of your partner(s). ? You take drugs by injection. ? You are  sexually active with a partner who has HIV.  Talk with your health care provider about whether you are at high risk of being infected with HIV. If you choose to begin PrEP, you should first be tested for HIV. You should then be tested every 3 months for as long as you are taking PrEP. Pregnancy  If you are premenopausal and you may become pregnant, ask your health care provider about preconception counseling.  If you may become  pregnant, take 400 to 800 micrograms (mcg) of folic acid every day.  If you want to prevent pregnancy, talk to your health care provider about birth control (contraception). Osteoporosis and menopause  Osteoporosis is a disease in which the bones lose minerals and strength with aging. This can result in serious bone fractures. Your risk for osteoporosis can be identified using a bone density scan.  If you are 63 years of age or older, or if you are at risk for osteoporosis and fractures, ask your health care provider if you should be screened.  Ask your health care provider whether you should take a calcium or vitamin D supplement to lower your risk for osteoporosis.  Menopause may have certain physical symptoms and risks.  Hormone replacement therapy may reduce some of these symptoms and risks. Talk to your health care provider about whether hormone replacement therapy is right for you. Follow these instructions at home:  Schedule regular health, dental, and eye exams.  Stay current with your immunizations.  Do not use any tobacco products including cigarettes, chewing tobacco, or electronic cigarettes.  If you are pregnant, do not drink alcohol.  If you are breastfeeding, limit how much and how often you drink alcohol.  Limit alcohol intake to no more than 1 drink per day for nonpregnant women. One drink equals 12 ounces of beer, 5 ounces of wine, or 1 ounces of hard liquor.  Do not use street drugs.  Do not share needles.  Ask your health care  provider for help if you need support or information about quitting drugs.  Tell your health care provider if you often feel depressed.  Tell your health care provider if you have ever been abused or do not feel safe at home. This information is not intended to replace advice given to you by your health care provider. Make sure you discuss any questions you have with your health care provider. Document Released: 07/05/2010 Document Revised: 05/28/2015 Document Reviewed: 09/23/2014 Elsevier Interactive Patient Education  2018 [employer]. Diet for Irritable Bowel Syndrome When you have irritable bowel syndrome (IBS), the foods you eat and your eating habits are very important. IBS may cause various symptoms, such as abdominal pain, constipation, or diarrhea. Choosing the right foods can help ease discomfort caused by these symptoms. Work with your health care provider and dietitian to find the best eating plan to help control your symptoms. What general guidelines do I need to follow?  Keep a food diary. This will help you identify foods that cause symptoms. Write down: ? What you eat and when. ? What symptoms you have. ? When symptoms occur in relation to your meals.  Avoid foods that cause symptoms. Talk with your dietitian about other ways to get the same nutrients that are in these foods.  Eat more foods that contain fiber. Take a fiber supplement if directed by your dietitian.  Eat your meals slowly, in a relaxed setting.  Aim to eat 5-6 small meals per day. Do not skip meals.  Drink enough fluids to keep your urine clear or pale yellow.  Ask your health care provider if you should take an over-the-counter probiotic during flare-ups to help restore healthy gut bacteria.  If you have cramping or diarrhea, try making your meals low in fat and high in carbohydrates. Examples of carbohydrates are pasta, rice, whole grain breads and cereals, fruits, and vegetables.  If dairy products  cause your symptoms to flare up, try eating less of them. You  might be able to handle yogurt better than other dairy products because it contains bacteria that help with digestion. What foods are not recommended? The following are some foods and drinks that may worsen your symptoms:  Fatty foods, such as [place] fries.  Milk products, such as cheese or ice cream.  Chocolate.  Alcohol.  Products with caffeine, such as coffee.  Carbonated drinks, such as soda.  The items listed above may not be a complete list of foods and beverages to avoid. Contact your dietitian for more information. What foods are good sources of fiber? Your health care provider or dietitian may recommend that you eat more foods that contain fiber. Fiber can help reduce constipation and other IBS symptoms. Add foods with fiber to your diet a little at a time so that your body can get used to them. Too much fiber at once might cause gas and swelling of your abdomen. The following are some foods that are good sources of fiber:  Apples.  Peaches.  Pears.  Berries.  Figs.  Broccoli (raw).  Cabbage.  Carrots.  Raw peas.  Kidney beans.  Lima beans.  Whole grain bread.  Whole grain cereal.  Where to find more information: [employer] for Functional Gastrointestinal Disorders: www.iffgd.[employer] of Diabetes and Digestive and Kidney Diseases: NetworkAffair.co.za.aspx This information is not intended to replace advice given to you by your health care provider. Make sure you discuss any questions you have with your health care provider. Document Released: 03/12/2003 Document Revised: 05/28/2015 Document Reviewed: 03/22/2013 Elsevier Interactive Patient Education  2018 [place]. Irritable Bowel Syndrome, Adult Irritable bowel syndrome (IBS) is not one specific disease. It is a group of symptoms that affects the  organs responsible for digestion (gastrointestinal or GI tract). To regulate how your GI tract works, your body sends signals back and forth between your intestines and your brain. If you have IBS, there may be a problem with these signals. As a result, your GI tract does not function normally. Your intestines may become more sensitive and overreact to certain things. This is especially true when you eat certain foods or when you are under stress. There are four types of IBS. These may be determined based on the consistency of your stool:  IBS with diarrhea.  IBS with constipation.  Mixed IBS.  Unsubtyped IBS.  It is important to know which type of IBS you have. Some treatments are more likely to be helpful for certain types of IBS. What are the causes? The exact cause of IBS is not known. What increases the risk? You may have a higher risk of IBS if:  You are a woman.  You are younger than 28 years old.  You have a family history of IBS.  You have mental health problems.  You have had bacterial infection of your GI tract.  What are the signs or symptoms? Symptoms of IBS vary from person to person. The main symptom is abdominal pain or discomfort. Additional symptoms usually include one or more of the following:  Diarrhea, constipation, or both.  Abdominal swelling or bloating.  Feeling full or sick after eating a small or regular-size meal.  Frequent gas.  Mucus in the stool.  A feeling of having more stool left after a bowel movement.  Symptoms tend to come and go. They may be associated with stress, psychiatric conditions, or nothing at all. How is this diagnosed? There is no specific test to diagnose IBS. Your health care provider will  make a diagnosis based on a physical exam, medical history, and your symptoms. You may have other tests to rule out other conditions that may be causing your symptoms. These may include:  Blood tests.  X-rays.  CT  scan.  Endoscopy and colonoscopy. This is a test in which your GI tract is viewed with a long, thin, flexible tube.  How is this treated? There is no cure for IBS, but treatment can help relieve symptoms. IBS treatment often includes:  Changes to your diet, such as: ? Eating more fiber. ? Avoiding foods that cause symptoms. ? Drinking more water. ? Eating regular, medium-sized portioned meals.  Medicines. These may include: ? Fiber supplements if you have constipation. ? Medicine to control diarrhea (antidiarrheal medicines). ? Medicine to help control muscle spasms in your GI tract (antispasmodic medicines). ? Medicines to help with any mental health issues, such as antidepressants or tranquilizers.  Therapy. ? Talk therapy may help with anxiety, depression, or other mental health issues that can make IBS symptoms worse.  Stress reduction. ? Managing your stress can help keep symptoms under control.  Follow these instructions at home:  Take medicines only as directed by your health care provider.  Eat a healthy diet. ? Avoid foods and drinks with added sugar. ? Include more whole grains, fruits, and vegetables gradually into your diet. This may be especially helpful if you have IBS with constipation. ? Avoid any foods and drinks that make your symptoms worse. These may include dairy products and caffeinated or carbonated drinks. ? Do not eat large meals. ? Drink enough fluid to keep your urine clear or pale yellow.  Exercise regularly. Ask your health care provider for recommendations of good activities for you.  Keep all follow-up visits as directed by your health care provider. This is important. Contact a health care provider if:  You have constant pain.  You have trouble or pain with swallowing.  You have worsening diarrhea. Get help right away if:  You have severe and worsening abdominal pain.  You have diarrhea and: ? You have a rash, stiff neck, or severe  headache. ? You are irritable, sleepy, or difficult to awaken. ? You are weak, dizzy, or extremely thirsty.  You have bright red blood in your stool or you have black tarry stools.  You have unusual abdominal swelling that is painful.  You vomit continuously.  You vomit blood (hematemesis).  You have both abdominal pain and a fever. This information is not intended to replace advice given to you by your health care provider. Make sure you discuss any questions you have with your health care provider. Document Released: 12/20/2004 Document Revised: 05/22/2015 Document Reviewed: 09/06/2013 Elsevier Interactive Patient Education  2018 [employer]. Probiotics What are probiotics? Probiotics are the good bacteria and yeasts that live in your body and keep you and your digestive system healthy. Probiotics also help your body's defense (immune) system and protect your body against bad bacterial growth. Certain foods contain probiotics, such as yogurt. Probiotics can also be purchased as a supplement. As with any supplement or drug, it is important to discuss its use with your health care provider. What affects the balance of bacteria in my body? The balance of bacteria in your body can be affected by:  Antibiotic medicines. Antibiotics are sometimes necessary to treat infection. Unfortunately, they may kill good or friendly bacteria in your body as well as the bad bacteria. This may lead to stomach problems like diarrhea, gas, and  cramping.  Disease. Some conditions are the result of an overgrowth of bad bacteria, yeasts, parasites, or fungi. These conditions include: ? Infectious diarrhea. ? Stomach and respiratory infections. ? Skin infections. ? Irritable bowel syndrome (IBS). ? Inflammatory bowel diseases. ? Ulcer due to Helicobacter pylori (H. pylori) infection. ? Tooth decay and periodontal disease. ? Vaginal infections.  Stress and poor diet may also lower the good bacteria in  your body. What type of probiotic is right for me? Probiotics are available over the counter at your local pharmacy, health food, or grocery store. They come in many different forms, combinations of strains, and dosing strengths. Some may need to be refrigerated. Always read the label for storage and usage instructions. Specific strains have been shown to be more effective for certain conditions. Ask your health care provider what option is best for you. Why would I need probiotics? There are many reasons your health care provider might recommend a probiotic supplement, including:  Diarrhea.  Constipation.  IBS.  Respiratory infections.  Yeast infections.  Acne, eczema, and other skin conditions.  Frequent urinary tract infections (UTIs).  Are there side effects of probiotics? Some people experience mild side effects when taking probiotics. Side effects are usually temporary and may include:  Gas.  Bloating.  Cramping.  Rarely, serious side effects, such as infection or immune system changes, may occur. What else do I need to know about probiotics?  There are many different strains of probiotics. Certain strains may be more effective depending on your condition. Probiotics are available in varying doses. Ask your health care provider which probiotic you should use and how often.  If you are taking probiotics along with antibiotics, it is generally recommended to wait at least 2 hours between taking the antibiotic and taking the probiotic. For more information: [facility] [employer]ron[employer].com.cy This information is not intended to replace advice given to you by your health care provider. Make sure you discuss any questions you have with your health care provider. Document Released: 07/17/2013 Document Revised: 11/17/2015 Document Reviewed: 03/19/2013 Elsevier Interactive Patient Education  2017 [employer].

## 2017-04-20 NOTE — Progress Notes (Signed)
H/O : N/V & abd. pain Allergies/ asthma

## 2017-04-21 LAB — BASIC METABOLIC PANEL WITH GFR
BUN/Creatinine Ratio: 6 — ABNORMAL LOW (ref 9–23)
BUN: 4 mg/dL — ABNORMAL LOW (ref 6–20)
CO2: 23 mmol/L (ref 20–29)
Calcium: 9.8 mg/dL (ref 8.7–10.2)
Chloride: 104 mmol/L (ref 96–106)
Creatinine, Ser: 0.7 mg/dL (ref 0.57–1.00)
GFR calc Af Amer: 137 mL/min/1.73
GFR calc non Af Amer: 119 mL/min/1.73
Glucose: 94 mg/dL (ref 65–99)
Potassium: 4.1 mmol/L (ref 3.5–5.2)
Sodium: 141 mmol/L (ref 134–144)

## 2017-04-21 LAB — CBC
Hematocrit: 44.8 % (ref 34.0–46.6)
Hemoglobin: 14.5 g/dL (ref 11.1–15.9)
MCH: 29.7 pg (ref 26.6–33.0)
MCHC: 32.4 g/dL (ref 31.5–35.7)
MCV: 92 fL (ref 79–97)
Platelets: 314 x10E3/uL (ref 150–379)
RBC: 4.88 x10E6/uL (ref 3.77–5.28)
RDW: 13.2 % (ref 12.3–15.4)
WBC: 4.9 x10E3/uL (ref 3.4–10.8)

## 2017-04-21 LAB — LIPID PANEL
CHOL/HDL RATIO: 3.2 ratio (ref 0.0–4.4)
Cholesterol, Total: 138 mg/dL (ref 100–199)
HDL: 43 mg/dL (ref >39)
LDL Calculated: 86 mg/dL (ref 0–99)
TRIGLYCERIDES: 45 mg/dL (ref 0–149)
VLDL Cholesterol Cal: 9 mg/dL (ref 5–40)

## 2017-04-23 ENCOUNTER — Encounter (INDEPENDENT_AMBULATORY_CARE_PROVIDER_SITE_OTHER): Payer: Self-pay | Admitting: Nurse Practitioner

## 2017-04-24 ENCOUNTER — Telehealth (INDEPENDENT_AMBULATORY_CARE_PROVIDER_SITE_OTHER): Payer: Self-pay

## 2017-04-24 NOTE — Telephone Encounter (Signed)
Patient aware of normal labs and encouraged to drink 48 oz of water daily, work on eating low fat heart healthy diet, participate in regular aerobic exercise program and exercise for 150 minutes a week. Maryjean Mornempestt S Roberts, CMA

## 2017-04-24 NOTE — Telephone Encounter (Signed)
-----   Message from Claiborne RiggZelda W Fleming, NP sent at 04/23/2017 10:35 PM EDT ----- Labs are essentially normal. Make sure you are drinking at least 48 oz of water per day. Work on eating a low fat, heart healthy diet and participate in regular aerobic exercise program to control as well. Exercise at least 150 minutes per week.

## 2017-04-28 ENCOUNTER — Encounter: Payer: Self-pay | Admitting: Obstetrics & Gynecology

## 2017-05-04 ENCOUNTER — Encounter (HOSPITAL_COMMUNITY): Payer: Self-pay | Admitting: Emergency Medicine

## 2017-05-04 ENCOUNTER — Inpatient Hospital Stay (HOSPITAL_COMMUNITY)
Admission: AD | Admit: 2017-05-04 | Discharge: 2017-05-04 | Discharge status: Absent without leave | Payer: Medicaid Other | Source: Ambulatory Visit | Attending: Obstetrics & Gynecology | Admitting: Obstetrics & Gynecology

## 2017-05-04 ENCOUNTER — Other Ambulatory Visit: Payer: Self-pay

## 2017-05-04 ENCOUNTER — Emergency Department (HOSPITAL_COMMUNITY)
Admission: EM | Admit: 2017-05-04 | Discharge: 2017-05-04 | Discharge status: Against Medical Advice | Payer: Medicaid Other | Attending: Emergency Medicine | Admitting: Emergency Medicine

## 2017-05-04 ENCOUNTER — Emergency Department (HOSPITAL_COMMUNITY): Payer: Medicaid Other

## 2017-05-04 DIAGNOSIS — O99330 Smoking (tobacco) complicating pregnancy, unspecified trimester: Secondary | ICD-10-CM | POA: Insufficient documentation

## 2017-05-04 DIAGNOSIS — O99519 Diseases of the respiratory system complicating pregnancy, unspecified trimester: Secondary | ICD-10-CM | POA: Diagnosis not present

## 2017-05-04 DIAGNOSIS — F17210 Nicotine dependence, cigarettes, uncomplicated: Secondary | ICD-10-CM | POA: Diagnosis not present

## 2017-05-04 DIAGNOSIS — O200 Threatened abortion: Secondary | ICD-10-CM | POA: Insufficient documentation

## 2017-05-04 DIAGNOSIS — Z3A00 Weeks of gestation of pregnancy not specified: Secondary | ICD-10-CM | POA: Insufficient documentation

## 2017-05-04 DIAGNOSIS — N939 Abnormal uterine and vaginal bleeding, unspecified: Secondary | ICD-10-CM

## 2017-05-04 DIAGNOSIS — Z91040 Latex allergy status: Secondary | ICD-10-CM | POA: Diagnosis not present

## 2017-05-04 DIAGNOSIS — J45909 Unspecified asthma, uncomplicated: Secondary | ICD-10-CM | POA: Insufficient documentation

## 2017-05-04 DIAGNOSIS — O209 Hemorrhage in early pregnancy, unspecified: Secondary | ICD-10-CM | POA: Diagnosis present

## 2017-05-04 DIAGNOSIS — R52 Pain, unspecified: Secondary | ICD-10-CM

## 2017-05-04 LAB — I-STAT BETA HCG BLOOD, ED (MC, WL, AP ONLY): HCG, QUANTITATIVE: 399.5 m[IU]/mL — AB (ref <5)

## 2017-05-04 LAB — HCG, QUANTITATIVE, PREGNANCY: HCG, BETA CHAIN, QUANT, S: 519 m[IU]/mL — AB (ref <5)

## 2017-05-04 MED ORDER — ACETAMINOPHEN 325 MG PO TABS
650.0000 mg | ORAL_TABLET | Freq: Once | ORAL | Status: AC
  Administered 2017-05-04: 650 mg via ORAL
  Filled 2017-05-04: qty 2

## 2017-05-04 NOTE — ED Notes (Signed)
Patient ambulated to Ultrasound

## 2017-05-04 NOTE — ED Notes (Signed)
Patient seen walking out of department with steady gait and no acute distress noted. Patient heard telling other staff members that she "can't wait any longer and needs to go." Provider aware. Will disposition patient accordingly.

## 2017-05-04 NOTE — ED Notes (Signed)
Lab called and mentioned that the CBC lab test had clotted, when this writer attempted to go redrawn lab samples, patient refused. Provider aware. Patient states " I just want to get on my [place] results.

## 2017-05-04 NOTE — ED Notes (Signed)
Pt has urine sample in room if order comes up.

## 2017-05-04 NOTE — ED Provider Notes (Signed)
[facility]-EMERGENCY DEPT Provider Note   CSN: [id number] Arrival date & time: 05/04/17  5784     History   Chief Complaint Chief Complaint  Patient presents with  . Vaginal Bleeding    HPI Tonya Gonzales is a 27 y.o. female.  HPI 27 y/o with hx of asthma comes in with cc of vaginal bleeding. Pt reports lower abdominal pain along with vaginal bleeding x 3 weeks. Pt's pain got worse so she came to the ER. Pt went through 3 pads in the last 24 hours. Pt is G2P0 and doesn't think she is pregnant. She has hx no serious pelvic disorder and pt denies vaginal discharge or trauma.  Past Medical History:  Diagnosis Date  . Asthma   . Herpes     There are no active problems to display for this patient.   History reviewed. No pertinent surgical history.   OB History   None      Home Medications    Prior to Admission medications   Medication Sig Start Date End Date Taking? Authorizing Provider  etonogestrel (IMPLANON) 68 MG IMPL implant Inject 1 each into the skin once.   Yes [provider]  albuterol (PROVENTIL HFA;VENTOLIN HFA) 108 (90 Base) MCG/ACT inhaler Inhale 2 puffs into the lungs every 6 (six) hours as needed for wheezing or shortness of breath. Patient not taking: Reported on 05/04/2017 04/20/17   Claiborne Rigg, NP    Family History Family History  Problem Relation Age of Onset  . Hypertension Mother   . Diabetes Mother   . Diabetes Maternal Grandmother     Social History Social History   Tobacco Use  . Smoking status: Current Every Day Smoker    Packs/day: 0.50    Types: Cigarettes  . Smokeless tobacco: Never Used  Substance Use Topics  . Alcohol use: Yes    Comment: RARE  . Drug use: Yes    Frequency: 7.0 times per week    Types: Marijuana     Allergies   Ibuprofen; Other; Latex; and Morphine and related   Review of Systems Review of Systems  Constitutional: Positive for activity change.  Genitourinary:  Positive for vaginal bleeding.  Neurological: Negative for dizziness.  Hematological: Does not bruise/bleed easily.  All other systems reviewed and are negative.    Physical Exam Updated Vital Signs BP 134/87   Pulse 64   Temp 98.4 F (36.9 C) (Oral)   Resp 16   LMP 04/19/2017   SpO2 100%   Physical Exam  Constitutional: She is oriented to person, place, and time. She appears well-developed.  HENT:  Head: Normocephalic and atraumatic.  Eyes: EOM are normal.  Neck: Normal range of motion. Neck supple.  Cardiovascular: Normal rate.  Pulmonary/Chest: Effort normal.  Abdominal: Bowel sounds are normal. There is tenderness. There is no rebound and no guarding.  Neurological: She is alert and oriented to person, place, and time.  Skin: Skin is warm and dry.  Nursing note and vitals reviewed.    ED Treatments / Results  Labs (all labs ordered are listed, but only abnormal results are displayed) Labs Reviewed  HCG, QUANTITATIVE, PREGNANCY - Abnormal; Notable for the following components:      Result Value   hCG, Beta Chain, Quant, S 519 (*)    All other components within normal limits  I-STAT BETA HCG BLOOD, ED ([facility], [facility], [facility] ONLY) - Abnormal; Notable for the following components:   I-stat hCG, quantitative 399.5 (*)  All other components within normal limits  WET PREP, GENITAL  TYPE AND SCREEN  GC/CHLAMYDIA PROBE AMP ([place]) NOT AT [facility]    EKG None  Radiology US Ob Comp Less 14 Wks  Result Date: 05/04/2017 CLINICAL DATA:  Pregnant patient with pelvic pain. EXAM: OBSTETRIC <14 WK [place] AND TRANSVAGINAL OB US TECHNIQUE: Both transabdominal and transvaginal ultrasound examinations were performed for complete evaluation of the gestation as well as the maternal uterus, adnexal regions, and pelvic cul-de-sac. Transvaginal technique was performed to assess early pregnancy. COMPARISON:  None. FINDINGS: Intrauterine gestational sac: None Yolk sac:  Not Visualized. Embryo:  Not  Visualized. Cardiac Activity: Not Visualized. Maternal uterus/adnexae: Normal right and left ovaries. No adnexal mass identified. Small amount of fluid in the pelvis. IMPRESSION: No intrauterine gestation identified. In the setting of positive pregnancy test and no definite intrauterine pregnancy, this reflects a pregnancy of unknown location. Differential considerations include early normal IUP, abnormal IUP, or nonvisualized ectopic pregnancy. Differentiation is achieved with serial beta HCG supplemented by repeat sonography as clinically warranted. Electronically Signed   By: Annia Belt M.D.   On: 05/04/2017 12:39   US Ob Transvaginal  Result Date: 05/04/2017 CLINICAL DATA:  Pregnant patient with pelvic pain. EXAM: OBSTETRIC <14 WK [place] AND TRANSVAGINAL OB US TECHNIQUE: Both transabdominal and transvaginal ultrasound examinations were performed for complete evaluation of the gestation as well as the maternal uterus, adnexal regions, and pelvic cul-de-sac. Transvaginal technique was performed to assess early pregnancy. COMPARISON:  None. FINDINGS: Intrauterine gestational sac: None Yolk sac:  Not Visualized. Embryo:  Not Visualized. Cardiac Activity: Not Visualized. Maternal uterus/adnexae: Normal right and left ovaries. No adnexal mass identified. Small amount of fluid in the pelvis. IMPRESSION: No intrauterine gestation identified. In the setting of positive pregnancy test and no definite intrauterine pregnancy, this reflects a pregnancy of unknown location. Differential considerations include early normal IUP, abnormal IUP, or nonvisualized ectopic pregnancy. Differentiation is achieved with serial beta HCG supplemented by repeat sonography as clinically warranted. Electronically Signed   By: Annia Belt M.D.   On: 05/04/2017 12:39    Procedures Procedures (including critical care time)  Medications Ordered in ED Medications  acetaminophen (TYLENOL) tablet 650 mg (650 mg Oral Given 05/04/17 1021)      Initial Impression / Assessment and Plan / ED Course  I have reviewed the triage vital signs and the nursing notes.  Pertinent labs & imaging results that were available during my care of the patient were reviewed by me and considered in my medical decision making (see chart for details).  Clinical Course as of May 04 1699  Thu May 04, 2017  1330 I went into complete the pelvic exam and discussed the results with the patient however she was gone.   [AN]  1351 I was able to get in touch with Ms. Thielen at phone number with last [phone]. I apologize for not being able to come and update her in a timely fashion. We discussed the ultrasound results as she was comfortable having that discussion over the phone.  Patient is not aware that her ultrasound results were equivocal, however that with a beta hCG count of 500, we were not surprised with the equivocal results.  Patient has been informed that with a first trimester bleed she is at high risk of having miscarriage, and that it is crucial that she follows up with an OB doctor for further management.  What is more concerning to me is that patient has Implanon  in place, and therefore is most likely she is going to miscarry.  She has been advised to return to the ER immediately if her bleeding gets worse.  Finally, patient also has been informed that this could be an ectopic pregnancy therefore if her pain gets worse or if she faints that she needs to come to the ER immediately.  Patient informs me that she will try to see an OB/GYN doctor as soon as possible.  She has not yet scheduled an appointment, but was planning on doing that.  I strongly urged her to go straight to women's hospitalist and that she was able to have them manage the care further and patient agreed.   [AN]    Clinical Course User Index [AN] Derwood Kaplan, MD    Pt comes in with cc of vaginal bleeding. She has lower quadrant abd pain. Pt's workup showed + pregnancy  test. Results from the ER workup discussed with the patient face to face and all questions answered to the best of my ability. Pt had initially refused the pelvic exam, then she was OK with it after we discussed the results. DDX; early iup with threatened miscarriage, incomplete miscarriage, std, ectopic pregnancy. [place] ordered- equivocal. When I went in to reassess the patient, she had left. RN informed me that pt had  Also refused blood work repeat, after her initial blood sample had clotted. We dont know pt's Rh status.    Final Clinical Impressions(s) / ED Diagnoses   Final diagnoses:  Vaginal bleeding  Threatened abortion    ED Discharge Orders    None       Derwood Kaplan, MD 05/04/17 1704

## 2017-05-04 NOTE — ED Notes (Signed)
Pt had drawn for labs:  Red Gold Blue Lavender Lt green Dark green x2 

## 2017-05-04 NOTE — ED Triage Notes (Signed)
Pt complaint of lower abdominal pain with vaginal bleeding for 3 weeks.

## 2017-05-06 ENCOUNTER — Encounter (HOSPITAL_COMMUNITY): Payer: Self-pay | Admitting: *Deleted

## 2017-05-06 ENCOUNTER — Other Ambulatory Visit: Payer: Self-pay

## 2017-05-06 ENCOUNTER — Inpatient Hospital Stay (HOSPITAL_COMMUNITY)
Admission: AD | Admit: 2017-05-06 | Discharge: 2017-05-06 | Disposition: A | Discharge status: Home / Self Care | Payer: Medicaid Other | Source: Ambulatory Visit | Attending: Obstetrics and Gynecology | Admitting: Obstetrics and Gynecology

## 2017-05-06 DIAGNOSIS — Z3A18 18 weeks gestation of pregnancy: Secondary | ICD-10-CM | POA: Diagnosis not present

## 2017-05-06 DIAGNOSIS — O26892 Other specified pregnancy related conditions, second trimester: Secondary | ICD-10-CM | POA: Insufficient documentation

## 2017-05-06 DIAGNOSIS — O209 Hemorrhage in early pregnancy, unspecified: Secondary | ICD-10-CM | POA: Diagnosis not present

## 2017-05-06 LAB — CBC WITH DIFFERENTIAL/PLATELET
BASOS PCT: 1 %
Basophils Absolute: 0 10*3/uL (ref 0.0–0.1)
EOS PCT: 2 %
Eosinophils Absolute: 0.1 10*3/uL (ref 0.0–0.7)
HCT: 37.3 % (ref 36.0–46.0)
Hemoglobin: 12.7 g/dL (ref 12.0–15.0)
LYMPHS PCT: 21 %
Lymphs Abs: 1.3 10*3/uL (ref 0.7–4.0)
MCH: 30.6 pg (ref 26.0–34.0)
MCHC: 34 g/dL (ref 30.0–36.0)
MCV: 89.9 fL (ref 78.0–100.0)
Monocytes Absolute: 0.3 10*3/uL (ref 0.1–1.0)
Monocytes Relative: 5 %
NEUTROS ABS: 4.4 10*3/uL (ref 1.7–7.7)
Neutrophils Relative %: 71 %
PLATELETS: 221 10*3/uL (ref 150–400)
RBC: 4.15 MIL/uL (ref 3.87–5.11)
RDW: 12.6 % (ref 11.5–15.5)
WBC: 6.1 10*3/uL (ref 4.0–10.5)

## 2017-05-06 LAB — HCG, QUANTITATIVE, PREGNANCY: hCG, Beta Chain, Quant, S: 618 m[IU]/mL — ABNORMAL HIGH (ref <5)

## 2017-05-06 LAB — ABO/RH: ABO/RH(D): A POS

## 2017-05-06 NOTE — MAU Provider Note (Signed)
[name]  is a 27 y.o. G1P0 at Unknown who presents to MAU today for follow-up quant hCG after 48 hours. The patient was seen in [place] log ER  on 5/2 and had quant hCG of 399.5 and US showed preg of unknown anatomical location. She denies/endorses no pain, spotting vaginal bleeding or fever today.   OB History  Gravida Para Term Preterm AB Living  1            SAB TAB Ectopic Multiple Live Births               # Outcome Date GA Lbr Len/2nd Weight Sex Delivery Anes PTL Lv  1 Current             Past Medical History:  Diagnosis Date  . Asthma   . Herpes     ROS: Spotty dark  VB No pain pain  BP 116/66 (BP Location: Right Arm)   Pulse 78   Temp 98.7 F (37.1 C) (Oral)   Resp 16   Wt 150 lb 12 oz (68.4 kg)   LMP  (LMP Unknown)   SpO2 100%   BMI 27.57 kg/m   CONSTITUTIONAL: Well-developed, well-nourished female in no acute distress.  MUSCULOSKELETAL: Normal range of motion.  CARDIOVASCULAR: Regular heart rate RESPIRATORY: Normal effort NEUROLOGICAL: Alert and oriented to person, place, and time.  SKIN: Not diaphoretic. No erythema. No pallor. PSYCH: Normal mood and affect. Normal behavior. Normal judgment and thought content.  Results for orders placed or performed during the hospital encounter of 05/06/17 (from the past 24 hour(s))  ABO/Rh     Status: None   Collection Time: 05/06/17 11:13 AM  Result Value Ref Range   ABO/RH(D)      A POS Performed at [facility], [address] [place], [place] [address]   CBC with Differential/Platelet     Status: None   Collection Time: 05/06/17 11:21 AM  Result Value Ref Range   WBC 6.1 4.0 - 10.5 K/uL   RBC 4.15 3.87 - 5.11 MIL/uL   Hemoglobin 12.7 12.0 - 15.0 g/dL   HCT 13.0 86.5 - 78.4 %   MCV 89.9 78.0 - 100.0 fL   MCH 30.6 26.0 - 34.0 pg   MCHC 34.0 30.0 - 36.0 g/dL   RDW 69.6 29.5 - 28.4 %   Platelets 221 150 - 400 K/uL   Neutrophils Relative % 71 %   Neutro Abs 4.4 1.7 - 7.7 K/uL   Lymphocytes  Relative 21 %   Lymphs Abs 1.3 0.7 - 4.0 K/uL   Monocytes Relative 5 %   Monocytes Absolute 0.3 0.1 - 1.0 K/uL   Eosinophils Relative 2 %   Eosinophils Absolute 0.1 0.0 - 0.7 K/uL   Basophils Relative 1 %   Basophils Absolute 0.0 0.0 - 0.1 K/uL  hCG, quantitative, pregnancy     Status: Abnormal   Collection Time: 05/06/17 11:21 AM  Result Value Ref Range   hCG, Beta Chain, Quant, S 618 (H) <5 mIU/mL    MDM: will repeat quant on Monday in clinic  A: Appropriate  rise in quant hCG after 48 hours  P: Discharge home First trimester/ectopic precautions discussed Patient will return for follow-up US in 1 week. Order placed and RN to schedule. Patient will return to [facility] for results following [place].  Patient may return to MAU as needed or if her condition were to change or worsen   Montez Morita, CNM 05/06/2017 12:53 PM

## 2017-05-06 NOTE — MAU Note (Addendum)
Still bleeding, has slowed down, but still heavy- changing twice a day. Cramping.   Had brown bleeding for 2 wks- then turned red, and had cramping.  Thought it was her period, found out preg when went to the hosp. Neg HPT in Feb and March.  Last normal period was January.

## 2017-05-08 ENCOUNTER — Inpatient Hospital Stay (HOSPITAL_COMMUNITY)
Admission: AD | Admit: 2017-05-08 | Discharge: 2017-05-08 | Disposition: A | Discharge status: Home / Self Care | Payer: Medicaid Other | Source: Ambulatory Visit | Attending: Obstetrics & Gynecology | Admitting: Obstetrics & Gynecology

## 2017-05-08 ENCOUNTER — Ambulatory Visit (INDEPENDENT_AMBULATORY_CARE_PROVIDER_SITE_OTHER): Payer: Medicaid Other

## 2017-05-08 ENCOUNTER — Ambulatory Visit: Payer: Medicaid Other | Admitting: Obstetrics & Gynecology

## 2017-05-08 DIAGNOSIS — O283 Abnormal ultrasonic finding on antenatal screening of mother: Secondary | ICD-10-CM

## 2017-05-08 DIAGNOSIS — O3680X0 Pregnancy with inconclusive fetal viability, not applicable or unspecified: Secondary | ICD-10-CM

## 2017-05-08 LAB — HCG, QUANTITATIVE, PREGNANCY: HCG, BETA CHAIN, QUANT, S: 590 m[IU]/mL — AB (ref <5)

## 2017-05-08 NOTE — Progress Notes (Signed)
I have reviewed the chart and agree with nursing staff's documentation of this patient's encounter.  Heather Hogan, CNM 05/08/2017 5:05 PM    

## 2017-05-08 NOTE — Progress Notes (Signed)
Pt here today for STAT beta lab.  Pt denies any sx.  Pt informed to stay for duration of test that may take up to two hours.  Pt stated understanding with no further questions. Notified Thressa Sheller, CNM pt's results.  Per provider recommendation pt needs to start methotrexate tx.  Informed pt of provider's recommendation.  Pt begins to cry however accepting to tx.  MAU given report.  Pt walked to MAU for tx.

## 2017-05-09 ENCOUNTER — Telehealth: Payer: Self-pay | Admitting: General Practice

## 2017-05-09 ENCOUNTER — Inpatient Hospital Stay (HOSPITAL_COMMUNITY): Payer: Medicaid Other

## 2017-05-09 ENCOUNTER — Other Ambulatory Visit: Payer: Self-pay

## 2017-05-09 ENCOUNTER — Encounter (HOSPITAL_COMMUNITY): Payer: Self-pay

## 2017-05-09 ENCOUNTER — Inpatient Hospital Stay (HOSPITAL_COMMUNITY)
Admission: AD | Admit: 2017-05-09 | Discharge: 2017-05-09 | DRG: 833 | Disposition: A | Discharge status: Home / Self Care | Payer: Medicaid Other | Source: Ambulatory Visit | Attending: Obstetrics and Gynecology | Admitting: Obstetrics and Gynecology

## 2017-05-09 ENCOUNTER — Ambulatory Visit: Payer: Medicaid Other | Admitting: Physician Assistant

## 2017-05-09 DIAGNOSIS — F17210 Nicotine dependence, cigarettes, uncomplicated: Secondary | ICD-10-CM | POA: Diagnosis present

## 2017-05-09 DIAGNOSIS — Z720 Tobacco use: Secondary | ICD-10-CM | POA: Diagnosis present

## 2017-05-09 DIAGNOSIS — O00101 Right tubal pregnancy without intrauterine pregnancy: Principal | ICD-10-CM | POA: Diagnosis present

## 2017-05-09 DIAGNOSIS — O00109 Unspecified tubal pregnancy without intrauterine pregnancy: Secondary | ICD-10-CM

## 2017-05-09 DIAGNOSIS — O00201 Right ovarian pregnancy without intrauterine pregnancy: Secondary | ICD-10-CM

## 2017-05-09 DIAGNOSIS — R109 Unspecified abdominal pain: Secondary | ICD-10-CM | POA: Diagnosis present

## 2017-05-09 DIAGNOSIS — Z975 Presence of (intrauterine) contraceptive device: Secondary | ICD-10-CM

## 2017-05-09 LAB — CBC
HCT: 35.7 % — ABNORMAL LOW (ref 36.0–46.0)
HEMATOCRIT: 37.3 % (ref 36.0–46.0)
HEMOGLOBIN: 12.9 g/dL (ref 12.0–15.0)
Hemoglobin: 12.4 g/dL (ref 12.0–15.0)
MCH: 30.4 pg (ref 26.0–34.0)
MCH: 30.8 pg (ref 26.0–34.0)
MCHC: 34.6 g/dL (ref 30.0–36.0)
MCHC: 34.7 g/dL (ref 30.0–36.0)
MCV: 87.8 fL (ref 78.0–100.0)
MCV: 88.6 fL (ref 78.0–100.0)
Platelets: 231 10*3/uL (ref 150–400)
Platelets: 199 10*3/uL (ref 150–400)
RBC: 4.25 MIL/uL (ref 3.87–5.11)
RBC: 4.03 MIL/uL (ref 3.87–5.11)
RDW: 12.8 % (ref 11.5–15.5)
RDW: 12.6 % (ref 11.5–15.5)
WBC: 6 10*3/uL (ref 4.0–10.5)
WBC: 5.3 10*3/uL (ref 4.0–10.5)

## 2017-05-09 LAB — COMPREHENSIVE METABOLIC PANEL
ALK PHOS: 62 U/L (ref 38–126)
ALT: 7 U/L — AB (ref 14–54)
AST: 23 U/L (ref 15–41)
Albumin: 3.9 g/dL (ref 3.5–5.0)
Anion gap: 12 (ref 5–15)
BUN: 7 mg/dL (ref 6–20)
CHLORIDE: 103 mmol/L (ref 101–111)
CO2: 22 mmol/L (ref 22–32)
Calcium: 8.8 mg/dL — ABNORMAL LOW (ref 8.9–10.3)
Creatinine, Ser: 0.7 mg/dL (ref 0.44–1.00)
GFR calc non Af Amer: >60 mL/min (ref >60)
Glucose, Bld: 92 mg/dL (ref 65–99)
Potassium: 4 mmol/L (ref 3.5–5.1)
SODIUM: 137 mmol/L (ref 135–145)
TOTAL PROTEIN: 6.9 g/dL (ref 6.5–8.1)
Total Bilirubin: 0.8 mg/dL (ref 0.3–1.2)

## 2017-05-09 LAB — TYPE AND SCREEN
ABO/RH(D): A POS
Antibody Screen: NEGATIVE

## 2017-05-09 LAB — HCG, QUANTITATIVE, PREGNANCY
HCG, BETA CHAIN, QUANT, S: 445 m[IU]/mL — AB (ref <5)
HCG, BETA CHAIN, QUANT, S: 480 m[IU]/mL — AB (ref <5)

## 2017-05-09 MED ORDER — ALUM & MAG HYDROXIDE-SIMETH 200-200-20 MG/5ML PO SUSP: 30.0000 mL | ORAL | Status: DC | PRN

## 2017-05-09 MED ORDER — METHOTREXATE INJECTION FOR WOMEN'S HOSPITAL
50.0000 mg/m2 | Freq: Once | INTRAMUSCULAR | Status: AC
  Administered 2017-05-09: 85 mg via INTRAMUSCULAR
  Filled 2017-05-09: qty 1.7

## 2017-05-09 MED ORDER — OXYCODONE HCL 5 MG PO TABS
5.0000 mg | ORAL_TABLET | Freq: Four times a day (QID) | ORAL | Status: DC | PRN
  Administered 2017-05-09 (×2): 5 mg via ORAL
  Filled 2017-05-09 (×3): qty 1

## 2017-05-09 MED ORDER — LACTATED RINGERS IV SOLN
INTRAVENOUS | Status: DC
  Administered 2017-05-09: 02:00:00 via INTRAVENOUS

## 2017-05-09 MED ORDER — OXYCODONE HCL 5 MG PO TABS: 5.0000 mg | ORAL_TABLET | Freq: Four times a day (QID) | ORAL | 0 refills | Status: DC | PRN

## 2017-05-09 MED ORDER — SOD CITRATE-CITRIC ACID 500-334 MG/5ML PO SOLN
ORAL | Status: AC
  Filled 2017-05-09: qty 15

## 2017-05-09 MED ORDER — FENTANYL CITRATE (PF) 100 MCG/2ML IJ SOLN
50.0000 ug | Freq: Once | INTRAMUSCULAR | Status: AC
  Administered 2017-05-09: 50 ug via INTRAMUSCULAR
  Filled 2017-05-09: qty 2

## 2017-05-09 NOTE — Progress Notes (Signed)
Faculty Practice OB/GYN Attending Note  Subjective:  Called to evaluate patient with increased upper abdominal pain. Patient reports that this was the same level of pain that brought her in but now migrated to upper abdomen. Mild lower abdominal cramping. No nausea. Her mother is at bedside.  Admitted on 05/09/2017 for Right tubal pregnancy.    Objective:  Blood pressure 106/70, pulse (!) 56, temperature 98.3 F (36.8 C), temperature source Oral, resp. rate 18, height 5' 2"  (1.575 m), weight 150 lb (68 kg), last menstrual period 01/09/2017, SpO2 99 %.  Gen: NAD HENT: Normocephalic, atraumatic Lungs: Normal respiratory effort Heart: Regular rate noted Abdomen: mild TTP diffusely, no rebound, no guarding Cervix: Deferred Ext: 2+ DTRs, no edema, no cyanosis, negative Homan's sign  Results for orders placed or performed during the hospital encounter of 05/09/17 (from the past 48 hour(s))  CBC     Status: None   Collection Time: 05/09/17  2:13 AM  Result Value Ref Range   WBC 6.0 4.0 - 10.5 K/uL   RBC 4.25 3.87 - 5.11 MIL/uL   Hemoglobin 12.9 12.0 - 15.0 g/dL   HCT 37.3 36.0 - 46.0 %   MCV 87.8 78.0 - 100.0 fL   MCH 30.4 26.0 - 34.0 pg   MCHC 34.6 30.0 - 36.0 g/dL   RDW 12.8 11.5 - 15.5 %   Platelets 231 150 - 400 K/uL    Comment: Performed at [facility], [address] [place], [place] [address]  Comprehensive metabolic panel     Status: Abnormal   Collection Time: 05/09/17  2:13 AM  Result Value Ref Range   Sodium 137 135 - 145 mmol/L   Potassium 4.0 3.5 - 5.1 mmol/L   Chloride 103 101 - 111 mmol/L   CO2 22 22 - 32 mmol/L   Glucose, Bld 92 65 - 99 mg/dL   BUN 7 6 - 20 mg/dL   Creatinine, Ser 0.70 0.44 - 1.00 mg/dL   Calcium 8.8 (L) 8.9 - 10.3 mg/dL   Total Protein 6.9 6.5 - 8.1 g/dL   Albumin 3.9 3.5 - 5.0 g/dL   AST 23 15 - 41 U/L   ALT 7 (L) 14 - 54 U/L   Alkaline Phosphatase 62 38 - 126 U/L   Total Bilirubin 0.8 0.3 - 1.2 mg/dL   GFR calc non Af Amer >60 >60  mL/min   GFR calc Af Amer >60 >60 mL/min    Comment: (NOTE) The eGFR has been calculated using the CKD EPI equation. This calculation has not been validated in all clinical situations. eGFR's persistently <60 mL/min signify possible Chronic Kidney Disease.    Anion gap 12 5 - 15    Comment: Performed at [facility], [address], [place],  [address]  hCG, quantitative, pregnancy     Status: Abnormal   Collection Time: 05/09/17  2:15 AM  Result Value Ref Range   hCG, Beta Chain, Quant, S 445 (H) <5 mIU/mL    Comment:          GEST. AGE      CONC.  (mIU/mL)   <=1 WEEK        5 - 50     2 WEEKS       50 - 500     3 WEEKS       100 - 10,000     4 WEEKS     1,000 - 30,000     5 WEEKS     3,500 -  115,000   6-8 WEEKS     12,000 - 270,000    12 WEEKS     15,000 - 220,000        FEMALE AND NON-PREGNANT FEMALE:     LESS THAN 5 mIU/mL Performed at [facility], [address], [place], [place] [address]   Type and screen [place]     Status: None   Collection Time: 05/09/17  2:15 AM  Result Value Ref Range   ABO/RH(D) A POS    Antibody Screen NEG    Sample Expiration      05/12/2017 Performed at [facility], [address], [place], [place] [address]   CBC     Status: Abnormal   Collection Time: 05/09/17  9:31 AM  Result Value Ref Range   WBC 5.3 4.0 - 10.5 K/uL   RBC 4.03 3.87 - 5.11 MIL/uL   Hemoglobin 12.4 12.0 - 15.0 g/dL   HCT 35.7 (L) 36.0 - 46.0 %   MCV 88.6 78.0 - 100.0 fL   MCH 30.8 26.0 - 34.0 pg   MCHC 34.7 30.0 - 36.0 g/dL   RDW 12.6 11.5 - 15.5 %   Platelets 199 150 - 400 K/uL    Comment: Performed at [facility], [address] [place], [place] [address]  hCG, quantitative, pregnancy     Status: Abnormal   Collection Time: 05/09/17  9:31 AM  Result Value Ref Range   hCG, Beta Chain, Quant, S 480 (H) <5 mIU/mL    Comment:          GEST. AGE      CONC.  (mIU/mL)   <=1 WEEK        5 - 50     2 WEEKS       50 -  500     3 WEEKS       100 - 10,000     4 WEEKS     1,000 - 30,000     5 WEEKS     3,500 - 115,000   6-8 WEEKS     12,000 - 270,000    12 WEEKS     15,000 - 220,000        FEMALE AND NON-PREGNANT FEMALE:     LESS THAN 5 mIU/mL Performed at [facility], [address] [place], [place] [address]    US Ob Comp Less 14 Wks  Result Date: 05/04/2017 CLINICAL DATA:  Pregnant patient with pelvic pain. EXAM: OBSTETRIC <14 WK [place] AND TRANSVAGINAL OB US TECHNIQUE: Both transabdominal and transvaginal ultrasound examinations were performed for complete evaluation of the gestation as well as the maternal uterus, adnexal regions, and pelvic cul-de-sac. Transvaginal technique was performed to assess early pregnancy. COMPARISON:  None. FINDINGS: Intrauterine gestational sac: None Yolk sac:  Not Visualized. Embryo:  Not Visualized. Cardiac Activity: Not Visualized. Maternal uterus/adnexae: Normal right and left ovaries. No adnexal mass identified. Small amount of fluid in the pelvis. IMPRESSION: No intrauterine gestation identified. In the setting of positive pregnancy test and no definite intrauterine pregnancy, this reflects a pregnancy of unknown location. Differential considerations include early normal IUP, abnormal IUP, or nonvisualized ectopic pregnancy. Differentiation is achieved with serial beta HCG supplemented by repeat sonography as clinically warranted. Electronically Signed   By: Lovey Newcomer M.D.   On: 05/04/2017 12:39   US Ob Transvaginal  Result Date: 05/09/2017 CLINICAL DATA:  Pelvic pain and bleeding. Serial beta HCG ranging from 519-618, the most recent on 05/08/2017  at 590. EXAM: TRANSVAGINAL OB ULTRASOUND TECHNIQUE: Transvaginal ultrasound was performed for complete evaluation of the gestation as well as the maternal uterus, adnexal regions, and pelvic cul-de-sac. COMPARISON:  05/04/2017 pelvic ultrasound FINDINGS: Intrauterine gestational sac: None Yolk sac:  Not Visualized. Embryo:  Not  Visualized. Cardiac Activity: Not Visualized. Heart Rate: Not applicable Subchorionic hemorrhage:  None visualized. Maternal uterus/adnexae: The endometrial lining is thin and homogeneous in appearance. Anteverted uterus is noted. Trace free fluid in the cul-de-sac, simple in appearance and not complex to suggest hemorrhage. Medial to the right ovary is a solid-appearing vascular abnormality measuring 2.3 x 1.6 x 1.7 cm, mixed in echogenicity with areas of central echogenicity with surrounding hypoechogenicity. Given that this appears new and with positive beta HCG, this could potentially represent an ectopic pregnancy. The right ovary measures approximately 3.7 x 2.3 x 1.8 cm and the left measures 3.4 x 3.6 x 1.3 cm. IMPRESSION: 1. New right adnexal masslike abnormality medial to the right ovary, mixed echogenicity with vascularity. This measures approximately 2.3 x 1.6 x 1.7 cm and is suspicious for possible ectopic pregnancy. Simple fluid in the cul-de-sac without evidence of active hemorrhage. 2. No intrauterine pregnancy is noted. These results were called by telephone at the time of interpretation on 05/09/2017 at 2:26 am to [place] , who verbally acknowledged these results. Electronically Signed   By: Ashley Royalty M.D.   On: 05/09/2017 02:26   US Ob Transvaginal  Result Date: 05/04/2017 CLINICAL DATA:  Pregnant patient with pelvic pain. EXAM: OBSTETRIC <14 WK [place] AND TRANSVAGINAL OB US TECHNIQUE: Both transabdominal and transvaginal ultrasound examinations were performed for complete evaluation of the gestation as well as the maternal uterus, adnexal regions, and pelvic cul-de-sac. Transvaginal technique was performed to assess early pregnancy. COMPARISON:  None. FINDINGS: Intrauterine gestational sac: None Yolk sac:  Not Visualized. Embryo:  Not Visualized. Cardiac Activity: Not Visualized. Maternal uterus/adnexae: Normal right and left ovaries. No adnexal mass identified. Small amount of fluid  in the pelvis. IMPRESSION: No intrauterine gestation identified. In the setting of positive pregnancy test and no definite intrauterine pregnancy, this reflects a pregnancy of unknown location. Differential considerations include early normal IUP, abnormal IUP, or nonvisualized ectopic pregnancy. Differentiation is achieved with serial beta HCG supplemented by repeat sonography as clinically warranted. Electronically Signed   By: Lovey Newcomer M.D.   On: 05/04/2017 12:39     Assessment & Plan:  27 y.o. [id number] admitted for observation in the setting of right tubal pregnancy.  Given increased pain, strongly advised surgical treatment given concern about rupture and internal hemorrhage which can lead to increased morbidity/mortality; patient adamantly refuses to undergo surgery. Feels she just wants pain medication and methotrexate; says she will follow up as needed for surveillance and the other doctor (Dr. Ilda Basset) already told her about the risks of rupture even when undergoing medical management. Patient's HCG level is still plateauing, but Hgb is stable as of this morning's labs. She is still NPO. Will obtain another ultrasound to objectively evaluate for rupture and if there is no concern, patient will be treated with methotrexate and will follow up in clinic for Day 4 and Day 7 labs.  Continue NPO until after ultrasound is done.  Continue close observation.  Verita Schneiders, MD, [place] for [employer], [place]

## 2017-05-09 NOTE — Telephone Encounter (Signed)
Patient aware of appointments on 05/12/17 and 05/15/17 at 10:30am.

## 2017-05-09 NOTE — Discharge Instructions (Signed)
Methotrexate Treatment for an Ectopic Pregnancy, Care After °Refer to this sheet in the next few weeks. These instructions provide you with information on caring for yourself after your procedure. Your health care provider may also give you more specific instructions. Your treatment has been planned according to current medical practices, but problems sometimes occur. Call your health care provider if you have any problems or questions after your procedure. °What can I expect after the procedure? °You may have some abdominal cramping, vaginal bleeding, and fatigue in the first few days after taking methotrexate. Some other possible side effects of methotrexate include: °· Nausea. °· Vomiting. °· Diarrhea. °· Mouth sores. °· Swelling or irritation of the lining of your lungs (pneumonitis). °· Liver damage. °· Hair loss. ° °Follow these instructions at home: °After you have received the methotrexate medicine, you need to be careful of your activities and watch your condition for several weeks. It may take 1 week before your hormone levels return to normal. °Activity °· Do not have sexual intercourse until your health care provider says it is safe to do so. °· You may resume your usual diet. °· Limit strenuous activity. °· Do not drink alcohol. °General instructions °· Do not take aspirin, ibuprofen, or naproxen (nonsteroidal anti-inflammatory drugs [NSAIDs]). °· Do not take folic acid, prenatal vitamins, or other vitamins that contain folic acid. °· Avoid traveling too far away from your health care provider. °· Keep all follow-up visits as told by your health care provider. This is important. °Contact a health care provider if: °· You cannot control your nausea and vomiting. °· You cannot control your diarrhea. °· You have sores in your mouth and want treatment. °· You need pain medicine for your abdominal pain. °· You have a rash. °· You are having a reaction to the medicine. °Get help right away if: °· You have  increasing abdominal or pelvic pain. °· You notice increased bleeding. °· You feel light-headed, or you faint. °· You have shortness of breath. °· Your heart rate increases. °· You have a cough. °· You have chills. °· You have a fever. °This information is not intended to replace advice given to you by your health care provider. Make sure you discuss any questions you have with your health care provider. °Document Released: 12/09/2010 Document Revised: 05/28/2015 Document Reviewed: 10/08/2012 °Elsevier Interactive Patient Education © 2017 Elsevier Inc. ° °

## 2017-05-09 NOTE — MAU Note (Signed)
Pt here with c/o worsening abdominal pain. Was here earlier and dx with ectopic pregnancy. Having some bleeding.

## 2017-05-09 NOTE — Progress Notes (Signed)
Dr. Macon Large at bedside discussing treatment options with patient.

## 2017-05-09 NOTE — Discharge Summary (Signed)
Physician Discharge Summary  Patient ID: Tonya Gonzales MRN: [id number] DOB/AGE: [birth year] 27 y.o.  Admit date: 05/09/2017 Discharge date: 05/09/2017  Admission Diagnoses:  Discharge Diagnoses:  Principal Problem:   Right tubal pregnancy Active Problems:   Tobacco abuse   Nexplanon in place   Discharged Condition: fair  Hospital Course: Patient presented to MAU with pain and was diagnosed with an ectopic pregnancy. Given concern about her pain, surgery was recommended but she adamantly refused this.  She was observed, and she has no signs of rupture of the ectopic pregnancy.  She did report continued pain but had no peritoneal signs on exam, and a repeat ultrasound 12 hours after admission showed no significant change. Given her level of pain, surgery was re-recommended and the risks of rupture of the ectopic pregnancy which could lead to internal bleeding and death were emphasized. Patient again adamantly refused, wanted methotrexate therapy even after she was told there could still be a risk of increased pain after methotrexate due to inflammation; and the risk of rupture is still present after medical therapy. She verbalized understanding of the risks and wanted the medication. Methotrexate was given as per protocol, strict ectopic precautions advised. She was told to follow up in the office at [facility] for her Day 4 and Day 7 labs, and to come to MAU for any concerning symptoms.   Consults: None  Significant Diagnostic Studies:  Results for orders placed or performed during the hospital encounter of 05/09/17 (from the past 72 hour(s))  CBC     Status: None   Collection Time: 05/09/17  2:13 AM  Result Value Ref Range   WBC 6.0 4.0 - 10.5 K/uL   RBC 4.25 3.87 - 5.11 MIL/uL   Hemoglobin 12.9 12.0 - 15.0 g/dL   HCT 37.3 36.0 - 46.0 %   MCV 87.8 78.0 - 100.0 fL   MCH 30.4 26.0 - 34.0 pg   MCHC 34.6 30.0 - 36.0 g/dL   RDW 12.8 11.5 - 15.5 %   Platelets 231 150 - 400 K/uL    Comment:  Performed at [facility], [address], [place], [place] [address]  Comprehensive metabolic panel     Status: Abnormal   Collection Time: 05/09/17  2:13 AM  Result Value Ref Range   Sodium 137 135 - 145 mmol/L   Potassium 4.0 3.5 - 5.1 mmol/L   Chloride 103 101 - 111 mmol/L   CO2 22 22 - 32 mmol/L   Glucose, Bld 92 65 - 99 mg/dL   BUN 7 6 - 20 mg/dL   Creatinine, Ser 0.70 0.44 - 1.00 mg/dL   Calcium 8.8 (L) 8.9 - 10.3 mg/dL   Total Protein 6.9 6.5 - 8.1 g/dL   Albumin 3.9 3.5 - 5.0 g/dL   AST 23 15 - 41 U/L   ALT 7 (L) 14 - 54 U/L   Alkaline Phosphatase 62 38 - 126 U/L   Total Bilirubin 0.8 0.3 - 1.2 mg/dL   GFR calc non Af Amer >60 >60 mL/min   GFR calc Af Amer >60 >60 mL/min    Comment: (NOTE) The eGFR has been calculated using the CKD EPI equation. This calculation has not been validated in all clinical situations. eGFR's persistently <60 mL/min signify possible Chronic Kidney Disease.    Anion gap 12 5 - 15    Comment: Performed at [facility], [address], [place], [place] [address]  hCG, quantitative, pregnancy     Status: Abnormal   Collection  Time: 05/09/17  2:15 AM  Result Value Ref Range   hCG, Beta Chain, Quant, S 445 (H) <5 mIU/mL    Comment:          GEST. AGE      CONC.  (mIU/mL)   <=1 WEEK        5 - 50     2 WEEKS       50 - 500     3 WEEKS       100 - 10,000     4 WEEKS     1,000 - 30,000     5 WEEKS     3,500 - 115,000   6-8 WEEKS     12,000 - 270,000    12 WEEKS     15,000 - 220,000        FEMALE AND NON-PREGNANT FEMALE:     LESS THAN 5 mIU/mL Performed at [facility], [address], [place], [place] [address]   Type and screen [place]     Status: None   Collection Time: 05/09/17  2:15 AM  Result Value Ref Range   ABO/RH(D) A POS    Antibody Screen NEG    Sample Expiration      05/12/2017 Performed at [facility], [address] [place], [place] [address]   CBC     Status: Abnormal   Collection  Time: 05/09/17  9:31 AM  Result Value Ref Range   WBC 5.3 4.0 - 10.5 K/uL   RBC 4.03 3.87 - 5.11 MIL/uL   Hemoglobin 12.4 12.0 - 15.0 g/dL   HCT 35.7 (L) 36.0 - 46.0 %   MCV 88.6 78.0 - 100.0 fL   MCH 30.8 26.0 - 34.0 pg   MCHC 34.7 30.0 - 36.0 g/dL   RDW 12.6 11.5 - 15.5 %   Platelets 199 150 - 400 K/uL    Comment: Performed at [facility], [address] [place], [place] [address]  hCG, quantitative, pregnancy     Status: Abnormal   Collection Time: 05/09/17  9:31 AM  Result Value Ref Range   hCG, Beta Chain, Quant, S 480 (H) <5 mIU/mL    Comment:          GEST. AGE      CONC.  (mIU/mL)   <=1 WEEK        5 - 50     2 WEEKS       50 - 500     3 WEEKS       100 - 10,000     4 WEEKS     1,000 - 30,000     5 WEEKS     3,500 - 115,000   6-8 WEEKS     12,000 - 270,000    12 WEEKS     15,000 - 220,000        FEMALE AND NON-PREGNANT FEMALE:     LESS THAN 5 mIU/mL Performed at [facility], [address] [place], [place] [address]   HIV antibody     Status: None   Collection Time: 05/09/17  9:31 AM  Result Value Ref Range   HIV Screen 4th Generation wRfx Non Reactive Non Reactive    Comment: (NOTE) Performed At: [facility] [place], [place] [id number] Rush Farmer MD WI:[id number] Performed at [facility], [address], [place], [place] [address]    US Ob Comp Less 14 Wks  Result Date: 05/04/2017 CLINICAL DATA:  Pregnant patient with pelvic pain. EXAM: OBSTETRIC <14 WK [place] AND TRANSVAGINAL OB US TECHNIQUE: Both transabdominal and transvaginal ultrasound examinations were performed for complete evaluation of the gestation as well as the maternal uterus, adnexal regions, and pelvic cul-de-sac. Transvaginal technique was performed to assess early pregnancy. COMPARISON:  None. FINDINGS: Intrauterine gestational sac: None Yolk sac:  Not Visualized. Embryo:  Not Visualized. Cardiac Activity: Not Visualized. Maternal uterus/adnexae: Normal right  and left ovaries. No adnexal mass identified. Small amount of fluid in the pelvis. IMPRESSION: No intrauterine gestation identified. In the setting of positive pregnancy test and no definite intrauterine pregnancy, this reflects a pregnancy of unknown location. Differential considerations include early normal IUP, abnormal IUP, or nonvisualized ectopic pregnancy. Differentiation is achieved with serial beta HCG supplemented by repeat sonography as clinically warranted. Electronically Signed   By: Lovey Newcomer M.D.   On: 05/04/2017 12:39   US Ob Transvaginal  Result Date: 05/09/2017 CLINICAL DATA:  Followup known ectopic pregnancy EXAM: TRANSVAGINAL OB ULTRASOUND TECHNIQUE: Transvaginal ultrasound was performed for complete evaluation of the gestation as well as the maternal uterus, adnexal regions, and pelvic cul-de-sac. COMPARISON:  Earlier today FINDINGS: Intrauterine gestational sac: None Yolk sac:  Not Visualized. Embryo:  Not Visualized. Maternal uterus/adnexae: Right ovary: Normal Left ovary: Normal Other :Again noted is an ill-defined mass adjacent to the right ovary with internal blood flow. This measures 3.3 x 1.8 x 1.6 cm (volume = 5 cm^3). 2.3 x 1.6 x 1.7 cm (volume = 3.3 cm^3). Free fluid: Small amount of complex and simple free fluid identified within the pelvis including the right adnexal region. Similar to previous exam. IMPRESSION: 1. Persistent ill-defined mass adjacent to right ovary which is concerning for ectopic pregnancy. Mildly increased in size to comparison exam. 2. A small to moderate amount of free fluid is identified within the pelvis. Not significantly changed in volume compared to the previous exam. Electronically Signed   By: Kerby Moors M.D.   On: 05/09/2017 14:39   US Ob Transvaginal  Result Date: 05/09/2017 CLINICAL DATA:  Pelvic pain and bleeding. Serial beta HCG ranging from 519-618, the most recent on 05/08/2017 at 590. EXAM: TRANSVAGINAL OB ULTRASOUND TECHNIQUE:  Transvaginal ultrasound was performed for complete evaluation of the gestation as well as the maternal uterus, adnexal regions, and pelvic cul-de-sac. COMPARISON:  05/04/2017 pelvic ultrasound FINDINGS: Intrauterine gestational sac: None Yolk sac:  Not Visualized. Embryo:  Not Visualized. Cardiac Activity: Not Visualized. Heart Rate: Not applicable Subchorionic hemorrhage:  None visualized. Maternal uterus/adnexae: The endometrial lining is thin and homogeneous in appearance. Anteverted uterus is noted. Trace free fluid in the cul-de-sac, simple in appearance and not complex to suggest hemorrhage. Medial to the right ovary is a solid-appearing vascular abnormality measuring 2.3 x 1.6 x 1.7 cm, mixed in echogenicity with areas of central echogenicity with surrounding hypoechogenicity. Given that this appears new and with positive beta HCG, this could potentially represent an ectopic pregnancy. The right ovary measures approximately 3.7 x 2.3 x 1.8 cm and the left measures 3.4 x 3.6 x 1.3 cm. IMPRESSION: 1. New right adnexal masslike abnormality medial to the right ovary, mixed echogenicity with vascularity. This measures approximately 2.3 x 1.6 x 1.7 cm and is suspicious for possible ectopic pregnancy. Simple fluid in the cul-de-sac without evidence of active hemorrhage. 2. No intrauterine pregnancy is noted. These results were called by telephone at the time of interpretation on 05/09/2017 at 2:26 am to [place] , who verbally acknowledged these results. Electronically  Signed   By: Ashley Royalty M.D.   On: 05/09/2017 02:26   US Ob Transvaginal  Result Date: 05/04/2017 CLINICAL DATA:  Pregnant patient with pelvic pain. EXAM: OBSTETRIC <14 WK [place] AND TRANSVAGINAL OB US TECHNIQUE: Both transabdominal and transvaginal ultrasound examinations were performed for complete evaluation of the gestation as well as the maternal uterus, adnexal regions, and pelvic cul-de-sac. Transvaginal technique was performed to  assess early pregnancy. COMPARISON:  None. FINDINGS: Intrauterine gestational sac: None Yolk sac:  Not Visualized. Embryo:  Not Visualized. Cardiac Activity: Not Visualized. Maternal uterus/adnexae: Normal right and left ovaries. No adnexal mass identified. Small amount of fluid in the pelvis. IMPRESSION: No intrauterine gestation identified. In the setting of positive pregnancy test and no definite intrauterine pregnancy, this reflects a pregnancy of unknown location. Differential considerations include early normal IUP, abnormal IUP, or nonvisualized ectopic pregnancy. Differentiation is achieved with serial beta HCG supplemented by repeat sonography as clinically warranted. Electronically Signed   By: Lovey Newcomer M.D.   On: 05/04/2017 12:39    Discharge Exam: Blood pressure 113/70, pulse 69, temperature 98 F (36.7 C), temperature source Oral, resp. rate 18, height 5' 2"  (1.575 m), weight 149 lb 14.6 oz (68 kg), last menstrual period 01/09/2017, SpO2 100 %. General appearance: alert and no distress Resp: clear to auscultation bilaterally Cardio: regular rate and rhythm GI: abnormal findings:  mild tenderness diffusely in lower abdomen, no rebound, no guarding Pelvic: deferred Extremities: extremities normal, atraumatic, no cyanosis or edema and [place] sign is negative, no sign of DVT    Discharge disposition: 01-Home or Self Care       Allergies as of 05/09/2017      Reactions   Ibuprofen Anaphylaxis   Sore throat associated.    Other Anaphylaxis   All fruits cause sore throat    Latex Hives, Rash   "burns skin"   Morphine And Related Hives, Rash      Medication List    STOP taking these medications   NEXPLANON 68 MG Impl implant Generic drug:  etonogestrel     TAKE these medications   albuterol 108 (90 Base) MCG/ACT inhaler Commonly known as:  PROVENTIL HFA;VENTOLIN HFA Inhale 2 puffs into the lungs every 6 (six) hours as needed for wheezing or shortness of breath.    oxyCODONE 5 MG immediate release tablet Commonly known as:  Oxy IR/ROXICODONE Take 1 tablet (5 mg total) by mouth every 6 (six) hours as needed for severe pain.      Follow-up [place] for [facility] Follow up in 3 day(s).   Specialty:  Obstetrics and Gynecology Why:  Lab draw Contact information: [place] [phone]          Signed: Verita Schneiders, MD 05/09/2017, 4:54 PM

## 2017-05-09 NOTE — Progress Notes (Signed)
Discharge instructions including follow up care, pain control/medication changes discussed with patient.  Patient verbalized understanding.  Paperwork signed at this time.

## 2017-05-09 NOTE — H&P (Signed)
History   CSN: [id number]  Arrival date and time: 05/09/17 7829   First Provider Initiated Contact with Patient 05/09/17 0124        Chief Complaint  Patient presents with  . Abdominal Pain  . Vaginal Bleeding   HPI Tonya Gonzales is a 27 y.o. G1P0 unknown gestational age who presents with abdominal pain. She states she woke up at midnight in severe pain. She also reports a small amount of bright red vaginal bleeding. She states she was told she has an ectopic pregnancy and needed methotrexate but did not stay for the labs and injection today.            OB History    Gravida  1   Para      Term      Preterm      AB      Living        SAB      TAB      Ectopic      Multiple      Live Births                  Past Medical History:  Diagnosis Date  . Asthma   . Herpes     No past surgical history on file.       Family History  Problem Relation Age of Onset  . Hypertension Mother   . Diabetes Mother   . Diabetes Maternal Grandmother     Social History        Tobacco Use  . Smoking status: Current Every Day Smoker    Packs/day: 0.50    Types: Cigarettes  . Smokeless tobacco: Never Used  Substance Use Topics  . Alcohol use: Yes    Comment: RARE  . Drug use: Yes    Frequency: 7.0 times per week    Types: Marijuana    Allergies:       Allergies  Allergen Reactions  . Ibuprofen Anaphylaxis    Sore throat associated.   . Other Anaphylaxis    All fruits cause sore throat   . Latex Hives and Rash    "burns skin"  . Morphine And Related Hives and Rash           Medications Prior to Admission  Medication Sig Dispense Refill Last Dose  . albuterol (PROVENTIL HFA;VENTOLIN HFA) 108 (90 Base) MCG/ACT inhaler Inhale 2 puffs into the lungs every 6 (six) hours as needed for wheezing or shortness of breath. (Patient not taking: Reported on 05/04/2017) 1 Inhaler 0 Not Taking at Unknown time    Review  of Systems  Constitutional: Negative.  Negative for fatigue and fever.  HENT: Negative.   Respiratory: Negative.  Negative for shortness of breath.   Cardiovascular: Negative.  Negative for chest pain.  Gastrointestinal: Positive for abdominal pain. Negative for constipation, diarrhea, nausea and vomiting.  Genitourinary: Positive for vaginal bleeding. Negative for dysuria.  Neurological: Negative.  Negative for dizziness and headaches.  Psychiatric/Behavioral:       Patient crying   Physical Exam   Blood pressure 133/88, pulse 81, temperature 98.3 F (36.8 C), temperature source Oral, resp. rate 20, height  (1.575 m), weight 150 lb (68 kg), last menstrual period 01/09/2017, SpO2 100 %.  Physical Exam  Nursing note and vitals reviewed. Constitutional: She is oriented to person, place, and time. She appears well-developed and well-nourished. No distress.  HENT:  Head: Normocephalic.  Eyes: Pupils are equal,  round, and reactive to light.  Cardiovascular: Normal rate, regular rhythm and normal heart sounds.  Respiratory: Effort normal and breath sounds normal. No respiratory distress.  GI: Soft. Bowel sounds are normal. She exhibits no distension. There is tenderness.  Neurological: She is alert and oriented to person, place, and time.  Skin: Skin is warm and dry.  Psychiatric: She has a normal mood and affect. Her behavior is normal. Judgment and thought content normal.    MAU Course  Procedures  Results for orders placed or performed in visit on 05/08/17 (from the past 24 hour(s))  hCG, quantitative, pregnancy     Status: Abnormal   Collection Time: 05/08/17  3:03 PM  Result Value Ref Range   hCG, Beta Chain, Quant, S 590 (H) <5 mIU/mL   US Ob Transvaginal  Result Date: 05/09/2017 CLINICAL DATA:  Pelvic pain and bleeding. Serial beta HCG ranging from 519-618, the most recent on 05/08/2017 at 590. EXAM: TRANSVAGINAL OB ULTRASOUND TECHNIQUE: Transvaginal ultrasound was  performed for complete evaluation of the gestation as well as the maternal uterus, adnexal regions, and pelvic cul-de-sac. COMPARISON:  05/04/2017 pelvic ultrasound FINDINGS: Intrauterine gestational sac: None Yolk sac:  Not Visualized. Embryo:  Not Visualized. Cardiac Activity: Not Visualized. Heart Rate: Not applicable Subchorionic hemorrhage:  None visualized. Maternal uterus/adnexae: The endometrial lining is thin and homogeneous in appearance. Anteverted uterus is noted. Trace free fluid in the cul-de-sac, simple in appearance and not complex to suggest hemorrhage. Medial to the right ovary is a solid-appearing vascular abnormality measuring 2.3 x 1.6 x 1.7 cm, mixed in echogenicity with areas of central echogenicity with surrounding hypoechogenicity. Given that this appears new and with positive beta HCG, this could potentially represent an ectopic pregnancy. The right ovary measures approximately 3.7 x 2.3 x 1.8 cm and the left measures 3.4 x 3.6 x 1.3 cm. IMPRESSION: 1. New right adnexal masslike abnormality medial to the right ovary, mixed echogenicity with vascularity. This measures approximately 2.3 x 1.6 x 1.7 cm and is suspicious for possible ectopic pregnancy. Simple fluid in the cul-de-sac without evidence of active hemorrhage. 2. No intrauterine pregnancy is noted. These results were called by telephone at the time of interpretation on 05/09/2017 at 2:26 am to midwife CAROLINE NEILL , who verbally acknowledged these results. Electronically Signed   By: Tollie Eth M.D.   On: 05/09/2017 02:26    MDM US OB Transvaginal Fentanyl IM Consulted with Dr. Vergie Living regarding results and clinical presentation- will admit to [facility] for observation and reevaluation of pain in the morning.   Assessment and Plan   1. Right ovarian pregnancy without intrauterine pregnancy    -Admit to [facility] for observation -NPO -CBC, CMP, HCG, and Type and Screen -LR maintence fluid -Dr. Vergie Living to  reevaluate patient in the AM  Rolm Bookbinder CNM 05/09/2017, 1:24 AM

## 2017-05-09 NOTE — Progress Notes (Signed)
I received call from radiologist, no evidence of hemoperitoneum seen.  As per discussion with patient, Methotrexate ordered; will follow up on Day 4 and 7 for HCG in clinic. Message sent to clinic. Ectopic precautions reviewed in detail and re-emphasized risk of rupture even with methotrexate.   Jaynie Collins, MD, FACOG Obstetrician & Gynecologist, [facility] for [employer], [facility]

## 2017-05-10 LAB — HIV ANTIBODY (ROUTINE TESTING W REFLEX): HIV SCREEN 4TH GENERATION: NONREACTIVE

## 2017-05-12 ENCOUNTER — Inpatient Hospital Stay (HOSPITAL_COMMUNITY)
Admission: AD | Admit: 2017-05-12 | Discharge: 2017-05-12 | Disposition: A | Discharge status: Home / Self Care | Payer: Medicaid Other | Source: Ambulatory Visit | Attending: Obstetrics & Gynecology | Admitting: Obstetrics & Gynecology

## 2017-05-12 ENCOUNTER — Ambulatory Visit (INDEPENDENT_AMBULATORY_CARE_PROVIDER_SITE_OTHER): Payer: Medicaid Other

## 2017-05-12 ENCOUNTER — Other Ambulatory Visit: Payer: Self-pay

## 2017-05-12 ENCOUNTER — Encounter (HOSPITAL_COMMUNITY): Payer: Self-pay

## 2017-05-12 DIAGNOSIS — Z79899 Other long term (current) drug therapy: Secondary | ICD-10-CM

## 2017-05-12 DIAGNOSIS — O00101 Right tubal pregnancy without intrauterine pregnancy: Secondary | ICD-10-CM | POA: Diagnosis not present

## 2017-05-12 DIAGNOSIS — Z5181 Encounter for therapeutic drug level monitoring: Secondary | ICD-10-CM

## 2017-05-12 DIAGNOSIS — O009 Unspecified ectopic pregnancy without intrauterine pregnancy: Secondary | ICD-10-CM | POA: Diagnosis present

## 2017-05-12 DIAGNOSIS — Z87891 Personal history of nicotine dependence: Secondary | ICD-10-CM | POA: Insufficient documentation

## 2017-05-12 LAB — HCG, QUANTITATIVE, PREGNANCY: HCG, BETA CHAIN, QUANT, S: 344 m[IU]/mL — AB (ref <5)

## 2017-05-12 NOTE — MAU Note (Signed)
Pt sent from office d/t continued vag bleeding & HCG levels not decreasing fast enough after Methotrexate Tues.

## 2017-05-12 NOTE — Discharge Instructions (Signed)
Ectopic Pregnancy °An ectopic pregnancy happens when a fertilized egg grows outside the uterus. A pregnancy cannot live outside of the uterus. This problem often happens in the fallopian tube. It is often caused by damage to the fallopian tube. °If this problem is found early, you may be treated with medicine. If your tube tears or bursts open (ruptures), you will bleed inside. This is an emergency. You will need surgery. Get help right away. °What are the signs or symptoms? °You may have normal pregnancy symptoms at first. These include: °· Missing your period. °· Feeling sick to your stomach (nauseous). °· Being tired. °· Having tender breasts. ° °Then, you may start to have symptoms that are not normal. These include: °· Pain with sex (intercourse). °· Bleeding from the vagina. This includes light bleeding (spotting). °· Belly (abdomen) or lower belly cramping or pain. This may be felt on one side. °· A fast heartbeat (pulse). °· Passing out (fainting) after going poop (bowel movement). ° °If your tube tears, you may have symptoms such as: °· Really bad pain in the belly or lower belly. This happens suddenly. °· Dizziness. °· Passing out. °· Shoulder pain. ° °Get help right away if: °You have any of these symptoms. This is an emergency. °This information is not intended to replace advice given to you by your health care provider. Make sure you discuss any questions you have with your health care provider. °Document Released: 03/18/2008 Document Revised: 05/28/2015 Document Reviewed: 08/01/2012 °Elsevier Interactive Patient Education © 2017 Elsevier Inc. ° °

## 2017-05-12 NOTE — MAU Provider Note (Signed)
History    Patient Tonya Gonzales is a 27 y.o. GTPAL here for assessment. She has a known ectopic pregnancy and is s/p methotrexate injection on 05-09-2017. She had her beta hcg level checked today (5-10) for her Day 4 levels. She was sent here from [facility] after she had an inappropriate drop in bchg.   She denies bleeding; endorses small abdominal pains consistent with what she has been feeling. No increase in pain. Is upset because she thinks she has to have surgery and does not want surgery.  CSN: [id number]  Arrival date and time: 05/12/17 1246   None     Chief Complaint  Patient presents with  . Vaginal Bleeding   HPI  OB History    Gravida  3   Para  2   Term  2   Preterm      AB      Living  2     SAB      TAB      Ectopic      Multiple      Live Births              Past Medical History:  Diagnosis Date  . Asthma   . Herpes     Past Surgical History:  Procedure Laterality Date  . NO PAST SURGERIES      Family History  Problem Relation Age of Onset  . Hypertension Mother   . Diabetes Mother   . Diabetes Maternal Grandmother     Social History   Tobacco Use  . Smoking status: Former Smoker    Packs/day: 0.50    Types: Cigarettes  . Smokeless tobacco: Never Used  Substance Use Topics  . Alcohol use: Yes    Comment: RARE  . Drug use: Yes    Frequency: 7.0 times per week    Types: Marijuana    Allergies:  Allergies  Allergen Reactions  . Ibuprofen Anaphylaxis    Sore throat associated.   . Other Anaphylaxis    All fruits cause sore throat   . Latex Hives and Rash    "burns skin"  . Morphine And Related Hives and Rash    Medications Prior to Admission  Medication Sig Dispense Refill Last Dose  . albuterol (PROVENTIL HFA;VENTOLIN HFA) 108 (90 Base) MCG/ACT inhaler Inhale 2 puffs into the lungs every 6 (six) hours as needed for wheezing or shortness of breath. (Patient not taking: Reported on 05/04/2017) 1 Inhaler 0 Not Taking at  Unknown time  . oxyCODONE (OXY IR/ROXICODONE) 5 MG immediate release tablet Take 1 tablet (5 mg total) by mouth every 6 (six) hours as needed for severe pain. 30 tablet 0     Review of Systems  Constitutional: Negative.   HENT: Negative.   Respiratory: Negative.   Cardiovascular: Negative.   Gastrointestinal: Negative.   Neurological: Negative.   Psychiatric/Behavioral: Negative.    Physical Exam   Blood pressure 131/73, pulse 61, temperature 98.9 F (37.2 C), temperature source Oral, resp. rate 16, last menstrual period 01/09/2017, SpO2 98 %.  Physical Exam  Constitutional: She appears well-developed.  HENT:  Head: Normocephalic.  Neck: Normal range of motion.  Respiratory: Effort normal.  GI: Soft.  Musculoskeletal: Normal range of motion.  Neurological: She is alert.  Skin: Skin is warm and dry.    MAU Course  Procedures  MDM -reviewed with Dr. Macon Large that patient's beta hcg had dropped by 28%, which is appropriate.   Assessment and Plan  1.  1. Encounter for methotrexate monitoring    2. Strict ectopic precautions given; return to clinic for Day 7 monitoring.   3. Patient verbalized understanding; all questions answered.    Charlesetta Garibaldi Alex Leahy 05/12/2017, 1:43 PM

## 2017-05-12 NOTE — Progress Notes (Signed)
Pt here today for day 3 methotrexate tx s/p ectopic pregnancy.  Pt reports having lower abdominal pain and no bleeding.  Pt advised to please wait in the waiting room for the duration of test that can last up to 2 hr.  Pt stated understanding.  Notified Steward Drone, CNM pt's results.  Per provider recommendation pt needs to go to MAU for f/u SAB.  I informed pt providers recommendation.  Pt began to cry verbalizing understanding.

## 2017-05-15 ENCOUNTER — Ambulatory Visit (INDEPENDENT_AMBULATORY_CARE_PROVIDER_SITE_OTHER): Payer: Medicaid Other | Admitting: *Deleted

## 2017-05-15 DIAGNOSIS — O00101 Right tubal pregnancy without intrauterine pregnancy: Secondary | ICD-10-CM

## 2017-05-15 LAB — HCG, QUANTITATIVE, PREGNANCY: hCG, Beta Chain, Quant, S: 270 m[IU]/mL — ABNORMAL HIGH (ref <5)

## 2017-05-15 NOTE — Progress Notes (Signed)
Chart reviewed for nurse visit. Agree with plan of care.   Sharyon Cable, CNM 05/15/2017 8:15 AM

## 2017-05-15 NOTE — Progress Notes (Signed)
Chart reviewed for nurse visit. Agree with plan of care.   Sharyon Cable, CNM 05/15/2017 1:50 PM

## 2017-05-15 NOTE — Progress Notes (Signed)
Here for stat bhcg day 7 after methotrexate. States was having sharp , intermittent pain RLQ =5- took oxycodone with complete relief. C/o light bleeding like light period. Explained will wait in lobby for results and then will review with provider and her. She voices understanding.   Discussed results with Steward Drone, CNM and Dr. Vergie Living.  Informed patient her levels had dropped sufficiently and recommend routine bhcg weekly until returned to normal. Also may see provider in 3 weeks if desires for birth control or follow up. She states she does not want to get pregnant again and has appt for nexplanon.

## 2017-05-16 ENCOUNTER — Telehealth (INDEPENDENT_AMBULATORY_CARE_PROVIDER_SITE_OTHER): Payer: Self-pay

## 2017-05-16 NOTE — Telephone Encounter (Signed)
-----   Message from Claiborne Rigg, NP sent at 05/15/2017  8:18 PM EDT ----- HIV test is negative

## 2017-05-16 NOTE — Progress Notes (Signed)
Erroneous encounter

## 2017-05-16 NOTE — Telephone Encounter (Signed)
Patient is aware of negative HIV. Tonya Gonzales, CMA  

## 2017-05-18 ENCOUNTER — Ambulatory Visit (INDEPENDENT_AMBULATORY_CARE_PROVIDER_SITE_OTHER): Payer: Medicaid Other | Admitting: Nurse Practitioner

## 2017-05-19 ENCOUNTER — Other Ambulatory Visit: Payer: Self-pay | Admitting: *Deleted

## 2017-05-19 DIAGNOSIS — O00101 Right tubal pregnancy without intrauterine pregnancy: Secondary | ICD-10-CM

## 2017-05-22 ENCOUNTER — Other Ambulatory Visit: Payer: Medicaid Other

## 2017-05-22 DIAGNOSIS — O00101 Right tubal pregnancy without intrauterine pregnancy: Secondary | ICD-10-CM

## 2017-05-23 LAB — BETA HCG QUANT (REF LAB): hCG Quant: 101 m[IU]/mL

## 2017-06-17 ENCOUNTER — Other Ambulatory Visit: Payer: Self-pay

## 2017-06-17 ENCOUNTER — Encounter (HOSPITAL_COMMUNITY): Payer: Self-pay | Admitting: *Deleted

## 2017-06-17 ENCOUNTER — Inpatient Hospital Stay (HOSPITAL_COMMUNITY)
Admission: AD | Admit: 2017-06-17 | Discharge: 2017-06-17 | Disposition: A | Discharge status: Home / Self Care | Payer: Medicaid Other | Attending: Obstetrics and Gynecology | Admitting: Obstetrics and Gynecology

## 2017-06-17 DIAGNOSIS — Z8759 Personal history of other complications of pregnancy, childbirth and the puerperium: Secondary | ICD-10-CM

## 2017-06-17 DIAGNOSIS — Z3202 Encounter for pregnancy test, result negative: Secondary | ICD-10-CM

## 2017-06-17 DIAGNOSIS — N939 Abnormal uterine and vaginal bleeding, unspecified: Secondary | ICD-10-CM | POA: Diagnosis not present

## 2017-06-17 LAB — URINALYSIS, ROUTINE W REFLEX MICROSCOPIC
Bilirubin Urine: NEGATIVE
GLUCOSE, UA: NEGATIVE mg/dL
Ketones, ur: NEGATIVE mg/dL
Leukocytes, UA: NEGATIVE
Nitrite: NEGATIVE
PH: 7 (ref 5.0–8.0)
Protein, ur: NEGATIVE mg/dL
RBC / HPF: >50 RBC/hpf — ABNORMAL HIGH (ref 0–5)
SPECIFIC GRAVITY, URINE: 1.014 (ref 1.005–1.030)

## 2017-06-17 LAB — CBC
HCT: 39.2 % (ref 36.0–46.0)
HEMOGLOBIN: 13.4 g/dL (ref 12.0–15.0)
MCH: 30.9 pg (ref 26.0–34.0)
MCHC: 34.2 g/dL (ref 30.0–36.0)
MCV: 90.3 fL (ref 78.0–100.0)
Platelets: 208 10*3/uL (ref 150–400)
RBC: 4.34 MIL/uL (ref 3.87–5.11)
RDW: 12.7 % (ref 11.5–15.5)
WBC: 5.3 10*3/uL (ref 4.0–10.5)

## 2017-06-17 LAB — POCT PREGNANCY, URINE: Preg Test, Ur: NEGATIVE

## 2017-06-17 LAB — HCG, QUANTITATIVE, PREGNANCY: hCG, Beta Chain, Quant, S: <1 m[IU]/mL (ref <5)

## 2017-06-17 NOTE — Progress Notes (Addendum)
G3P2 @ ? Pregnant?. HPT + last night. MAU test negative. Started bleeding heavy this morning. Can NOT remember her last period. Unsure if today is start of her cycle.   Lab at bs   1225: pending quant Hcg  1231: pt called out. infromed provider will be in since results just resulted.   1233: provider at bs reassessing pt.   1335: orders received to d/c pt home.   D/c instructions given with pt understanding. Pt left unit via ambulatory

## 2017-06-17 NOTE — MAU Provider Note (Signed)
History     CSN: [id number]  Arrival date and time: 06/17/17 1000   First Provider Initiated Contact with Patient 06/17/17 1109      Chief Complaint  Patient presents with  . Vaginal Bleeding  . Possible Pregnancy   HPI Tonya Gonzales is a 27 y.o. [phone] female who presents with vaginal bleeding and possible pregnancy. She was treated for ectopic pregnancy last month. Last HCG checked was 3 weeks ago and down to 101. Has not had follow up since then. Reports a positive HPT last week. She has had unprotected intercourse since her last visit. Reports started bleeding this morning. States initially was heavier than her normal menses but has slowed down since arriving here.   OB History    Gravida  3   Para  2   Term  2   Preterm      AB  1   Living  2     SAB      TAB      Ectopic  1   Multiple      Live Births           Obstetric Comments  Ectopic treated with MTX        Past Medical History:  Diagnosis Date  . Asthma   . Herpes     Past Surgical History:  Procedure Laterality Date  . NO PAST SURGERIES      Family History  Problem Relation Age of Onset  . Hypertension Mother   . Diabetes Mother   . Diabetes Maternal Grandmother     Social History   Tobacco Use  . Smoking status: Former Smoker    Packs/day: 0.50    Types: Cigarettes  . Smokeless tobacco: Never Used  Substance Use Topics  . Alcohol use: Yes    Comment: RARE  . Drug use: Yes    Frequency: 7.0 times per week    Types: Marijuana    Allergies:  Allergies  Allergen Reactions  . Ibuprofen Anaphylaxis    Sore throat associated.   . Other Anaphylaxis    All fruits cause sore throat   . Latex Hives and Rash    "burns skin"  . Morphine And Related Hives and Rash    Medications Prior to Admission  Medication Sig Dispense Refill Last Dose  . albuterol (PROVENTIL HFA;VENTOLIN HFA) 108 (90 Base) MCG/ACT inhaler Inhale 2 puffs into the lungs every 6 (six) hours as needed  for wheezing or shortness of breath. (Patient not taking: Reported on 05/04/2017) 1 Inhaler 0 Not Taking at Unknown time  . oxyCODONE (OXY IR/ROXICODONE) 5 MG immediate release tablet Take 1 tablet (5 mg total) by mouth every 6 (six) hours as needed for severe pain. 30 tablet 0     Review of Systems  Constitutional: Negative.   Gastrointestinal: Positive for abdominal pain.  Genitourinary: Positive for vaginal bleeding.   Physical Exam   Blood pressure 119/85, pulse 81, temperature 98.8 F (37.1 C), temperature source Oral, resp. rate 17, weight 153 lb 1.3 oz (69.4 kg), last menstrual period 01/09/2017, SpO2 100 %, unknown if currently breastfeeding.  Physical Exam  Nursing note and vitals reviewed. Constitutional: She is oriented to person, place, and time. She appears well-developed and well-nourished. No distress.  HENT:  Head: Normocephalic and atraumatic.  Eyes: Conjunctivae are normal. Right eye exhibits no discharge. Left eye exhibits no discharge. No scleral icterus.  Neck: Normal range of motion.  Respiratory: Effort normal. No respiratory distress.  GI: Soft. There is no tenderness.  Neurological: She is alert and oriented to person, place, and time.  Skin: Skin is warm and dry. She is not diaphoretic.  Psychiatric: She has a normal mood and affect. Her behavior is normal. Judgment and thought content normal.    MAU Course  Procedures Results for orders placed or performed during the hospital encounter of 06/17/17 (from the past 24 hour(s))  Urinalysis, Routine w reflex microscopic     Status: Abnormal   Collection Time: 06/17/17 10:31 AM  Result Value Ref Range   Color, Urine YELLOW YELLOW   APPearance CLEAR CLEAR   Specific Gravity, Urine 1.014 1.005 - 1.030   pH 7.0 5.0 - 8.0   Glucose, UA NEGATIVE NEGATIVE mg/dL   Hgb urine dipstick LARGE (A) NEGATIVE   Bilirubin Urine NEGATIVE NEGATIVE   Ketones, ur NEGATIVE NEGATIVE mg/dL   Protein, ur NEGATIVE NEGATIVE mg/dL    Nitrite NEGATIVE NEGATIVE   Leukocytes, UA NEGATIVE NEGATIVE   RBC / HPF >50 (H) 0 - 5 RBC/hpf   WBC, UA 0-5 0 - 5 WBC/hpf   Bacteria, UA RARE (A) NONE SEEN   Squamous Epithelial / LPF 0-5 0 - 5   Mucus PRESENT   Pregnancy, urine POC     Status: None   Collection Time: 06/17/17 10:34 AM  Result Value Ref Range   Preg Test, Ur NEGATIVE NEGATIVE  CBC     Status: None   Collection Time: 06/17/17 11:11 AM  Result Value Ref Range   WBC 5.3 4.0 - 10.5 K/uL   RBC 4.34 3.87 - 5.11 MIL/uL   Hemoglobin 13.4 12.0 - 15.0 g/dL   HCT 16.139.2 09.636.0 - 04.546.0 %   MCV 90.3 78.0 - 100.0 fL   MCH 30.9 26.0 - 34.0 pg   MCHC 34.2 30.0 - 36.0 g/dL   RDW 40.912.7 81.111.5 - 91.415.5 %   Platelets 208 150 - 400 K/uL  hCG, quantitative, pregnancy     Status: None   Collection Time: 06/17/17 11:11 AM  Result Value Ref Range   hCG, Beta Chain, Quant, S <1 <5 mIU/mL    MDM UPT negative CBC & HCG HCG <1 Pt with minimal bleeding. Discussed results with patient, she is reassured. She has f/u in office on Monday for routine gyn exam.   Assessment and Plan  A: 1. Hx of ectopic pregnancy   2. Pregnancy examination or test, negative result   3. Vaginal bleeding    P: Discharge home Discussed reasons to return to MAU Keep scheduled appt  Judeth Hornrin Raniah Karan 06/17/2017, 11:09 AM

## 2017-06-17 NOTE — MAU Note (Signed)
Tonya Gonzales is a 27 y.o. at unkown here in MAU reporting: +HPT last night +vaginal bleeding. Having to wear a pad. Bright red in color. LMP: unknown. States has not had a period since April when she was seen for an ectopic and received methotrexate. +lower abdominal cramping. None now but some slight cramping yesterday  Onset of complaint: yesterday Pain score: none currently Vitals:   06/17/17 1032  BP: 134/73  Pulse: (!) 104  Resp: 17  Temp: 98.8 F (37.1 C)  SpO2: 100%     Lab orders placed from triage: ua and poc pregnancy test

## 2017-06-19 ENCOUNTER — Ambulatory Visit: Payer: Medicaid Other | Admitting: Obstetrics & Gynecology

## 2017-07-13 ENCOUNTER — Ambulatory Visit (INDEPENDENT_AMBULATORY_CARE_PROVIDER_SITE_OTHER): Payer: Medicaid Other | Admitting: Obstetrics & Gynecology

## 2017-07-13 ENCOUNTER — Encounter: Payer: Self-pay | Admitting: Obstetrics & Gynecology

## 2017-07-13 VITALS — BP 128/79 | HR 79 | Wt 149.6 lb

## 2017-07-13 DIAGNOSIS — Z3046 Encounter for surveillance of implantable subdermal contraceptive: Secondary | ICD-10-CM

## 2017-07-13 DIAGNOSIS — O00109 Unspecified tubal pregnancy without intrauterine pregnancy: Secondary | ICD-10-CM

## 2017-07-13 DIAGNOSIS — Z30017 Encounter for initial prescription of implantable subdermal contraceptive: Secondary | ICD-10-CM

## 2017-07-13 DIAGNOSIS — F419 Anxiety disorder, unspecified: Secondary | ICD-10-CM

## 2017-07-13 MED ORDER — ETONOGESTREL 68 MG ~~LOC~~ IMPL
68.0000 mg | DRUG_IMPLANT | Freq: Once | SUBCUTANEOUS | Status: AC
  Administered 2017-07-13: 68 mg via SUBCUTANEOUS

## 2017-07-13 NOTE — Patient Instructions (Signed)
Human Papillomavirus Quadrivalent Vaccine suspension for injection What is this medicine? HUMAN PAPILLOMAVIRUS VACCINE (HYOO muhn pap uh LOH muh vahy ruhs vak SEEN) is a vaccine. It is used to prevent infections of four types of the human papillomavirus. In women, the vaccine may lower your risk of getting cervical, vaginal, vulvar, or anal cancer and genital warts. In men, the vaccine may lower your risk of getting genital warts and anal cancer. You cannot get these diseases from the vaccine. This vaccine does not treat these diseases. This medicine may be used for other purposes; ask your health care provider or pharmacist if you have questions. COMMON BRAND NAME(S): Gardasil What should I tell my health care provider before I take this medicine? They need to know if you have any of these conditions: -fever or infection -hemophilia -HIV infection or AIDS -immune system problems -low platelet count -an unusual reaction to Human Papillomavirus Vaccine, yeast, other medicines, foods, dyes, or preservatives -pregnant or trying to get pregnant -breast-feeding How should I use this medicine? This vaccine is for injection in a muscle on your upper arm or thigh. It is given by a health care professional. Dennis Bast will be observed for 15 minutes after each dose. Sometimes, fainting happens after the vaccine is given. You may be asked to sit or lie down during the 15 minutes. Three doses are given. The second dose is given 2 months after the first dose. The last dose is given 4 months after the second dose. A copy of a Vaccine Information Statement will be given before each vaccination. Read this sheet carefully each time. The sheet may change frequently. Talk to your pediatrician regarding the use of this medicine in children. While this drug may be prescribed for children as young as 11 years of age for selected conditions, precautions do apply. Overdosage: If you think you have taken too much of this  medicine contact a poison control center or emergency room at once. NOTE: This medicine is only for you. Do not share this medicine with others. What if I miss a dose? All 3 doses of the vaccine should be given within 6 months. Remember to keep appointments for follow-up doses. Your health care provider will tell you when to return for the next vaccine. Ask your health care professional for advice if you are unable to keep an appointment or miss a scheduled dose. What may interact with this medicine? -other vaccines This list may not describe all possible interactions. Give your health care provider a list of all the medicines, herbs, non-prescription drugs, or dietary supplements you use. Also tell them if you smoke, drink alcohol, or use illegal drugs. Some items may interact with your medicine. What should I watch for while using this medicine? This vaccine may not fully protect everyone. Continue to have regular pelvic exams and cervical or anal cancer screenings as directed by your doctor. The Human Papillomavirus is a sexually transmitted disease. It can be passed by any kind of sexual activity that involves genital contact. The vaccine works best when given before you have any contact with the virus. Many people who have the virus do not have any signs or symptoms. Tell your doctor or health care professional if you have any reaction or unusual symptom after getting the vaccine. What side effects may I notice from receiving this medicine? Side effects that you should report to your doctor or health care professional as soon as possible: -allergic reactions like skin rash, itching or hives, swelling  of the face, lips, or tongue -breathing problems -feeling faint or lightheaded, falls Side effects that usually do not require medical attention (report to your doctor or health care professional if they continue or are bothersome): -cough -dizziness -fever -headache -nausea -redness, warmth,  swelling, pain, or itching at site where injected This list may not describe all possible side effects. Call your doctor for medical advice about side effects. You may report side effects to FDA at 1-800-FDA-1088. Where should I keep my medicine? This drug is given in a hospital or clinic and will not be stored at home. NOTE: This sheet is a summary. It may not cover all possible information. If you have questions about this medicine, talk to your doctor, pharmacist, or health care provider.  2018 Elsevier/Gold Standard (2013-02-11 13:14:33) Preventing Cervical Cancer Cervical cancer is cancer that grows on the cervix. The cervix is at the bottom of the uterus. It connects the uterus to the vagina. The uterus is where a baby develops during pregnancy. Cancer occurs when cells become abnormal and start to grow out of control. Cervical cancer grows slowly and may not cause any symptoms at first. Over time, the cancer can grow deep into the cervix tissue and spread to other areas. If it is found early, cervical cancer can be treated effectively. You can also take steps to prevent this type of cancer. Most cases of cervical cancer are caused by an STI (sexually transmitted infection) called human papillomavirus (HPV). One way to reduce your risk of cervical cancer is to avoid infection with the HPV virus. You can do this by practicing safe sex and by getting the HPV vaccine. Getting regular Pap tests is also important because this can help identify changes in cells that could lead to cancer. Your chances of getting this disease can also be reduced by making certain lifestyle changes. How can I protect myself from cervical cancer? Preventing HPV infection  Ask your health care provider about getting the HPV vaccine. If you are 109 years old or younger, you may need to get this vaccine, which is given in three doses over 6 months. This vaccine protects against the types of HPV that could cause  cancer.  Limit the number of people you have sex with. Also avoid having sex with people who have had many sex partners.  Use a latex condom during sex. Getting Pap tests  Get Pap tests regularly, starting at age 38. Talk with your health care provider about how often you need these tests. ? Most women who are 65?28 years of age should have a Pap test every 3 years. ? Most women who are 64?28 years of age should have a Pap test in combination with an HPV test every 5 years. ? Women with a higher risk of cervical cancer, such as those with a weakened immune system or those who have been exposed to the drug diethylstilbestrol (DES), may need more frequent testing. Making other lifestyle changes  Do not use any products that contain nicotine or tobacco, such as cigarettes and e-cigarettes. If you need help quitting, ask your health care provider.  Eat at least 5 servings of fruits and vegetables every day.  Lose weight if you are overweight. Why are these changes important?  These changes and screening tests are designed to address the factors that are known to increase the risk of cervical cancer. Taking these steps is the best way to reduce your risk.  Having regular Pap tests will help identify  changes in cells that could lead to cancer. Steps can then be taken to prevent cancer from developing.  These changes will also help find cervical cancer early. This type of cancer can be treated effectively if it is found early. It can be more dangerous and difficult to treat if cancer has grown deep into your cervix or has spread.  In addition to making you less likely to get cervical cancer, these changes will also provide other health benefits, such as the following: ? Practicing safe sex is important for preventing STIs and unplanned pregnancies. ? Avoiding tobacco can reduce your risk for other cancers and health issues. ? Eating a healthy diet and maintaining a healthy weight are good for  your overall health. What can happen if changes are not made? In the early stages, cervical cancer might not have any symptoms. It can take many years for the cancer to grow and get deep into the cervix tissue. This may be happening without you knowing about it. If you develop any symptoms, such as pelvic pain or unusual discharge or bleeding from your vagina, you should see your health care provider right away. If cervical cancer is not found early, you might need treatments such as radiation, chemotherapy, or surgery. In some cases, surgery may mean that you will not be able to get pregnant or carry a pregnancy to term. Where to find support: Talk with your health care provider, school nurse, or local health department for guidance about screening and vaccination. Some children and teens may be able to get the HPV vaccine free of charge through the U.[place]. government's Vaccines for Children [facility]) program. Other places that provide vaccinations include:  Public health clinics. Check with your local health department.  [place], where you would pay only what you can afford. To find one near you, check this website: http://lyons.com/  [place]. These are part of a program for Medicare and Medicaid patients who live in rural areas.  The National Breast and Cervical Cancer Early Detection Program also provides breast and cervical cancer screenings and diagnostic services to low-income, uninsured, and underinsured women. Cervical cancer can be passed down through families. Talk with your health care provider or genetic counselor to learn more about genetic testing for cancer. Where to find more information: Learn more about cervical cancer from:  [employer]ne[employer]: WirelessShades.ch  American Cancer Society: www.cancer.org/cancer/cervicalcancer/  U.S. Centers for Disease Control and Prevention:  ParisianParasols.gl  Summary  Talk with your health care provider about getting the HPV vaccine.  Be sure to get regular Pap tests as recommended by your health care provider.  See your health care provider right away if you have any pelvic pain or unusual discharge or bleeding from your vagina. This information is not intended to replace advice given to you by your health care provider. Make sure you discuss any questions you have with your health care provider. Document Released: 01/04/2015 Document Revised: 08/18/2015 Document Reviewed: 08/18/2015 Elsevier Interactive Patient Education  [employer].

## 2017-07-13 NOTE — Progress Notes (Signed)
Pt agreed to see Behavioral Health Clinician at her next visit.

## 2017-07-13 NOTE — Progress Notes (Signed)
   Subjective:    Patient ID: Tonya Gonzales, female    DOB: 09-13-1990, 27 y.o.   MRN: [id number]  HPI 27 yo single P2 (8 and 28 yo kids) here to have her expired Nexplanon removed and have a new one inserted. She was treated with MTX for an ectopic pregnancy last month. Her last QBHCG was 100 although her UPT was negative in June and today.   Review of Systems     Objective:   Physical Exam  Breathing, conversing, and ambulating normally Well nourished, well hydrated Black female, no apparent distress Consent was signed and time out was done. Her left arm was prepped with betadine after establishing the position of the Nexplanon. The area was infiltrated with 2 cc of 1% lidocaine. A small incision was made and the intact rod was easily removed and noted to be intact.  I then placed a new Nexplanon according to standard of care. A steristrip was placed and her arm was noted to be hemostatic. It was bandaged.  She tolerated the procedure well.      Assessment & Plan:  Preventative care- As of today she declines TDAP and Gardasil and pap smear.  I have given her information on both Contaception- Nexplanon She will schedule an annual exam in 5 weeks Anxiety and elevated PHQ9 score- She has agreed to see Tonya Gonzales at her annual exam

## 2017-07-13 NOTE — Addendum Note (Signed)
Addended by: Gerome ApleyZEYFANG, LINDA L on: 07/13/2017 09:07 AM   Modules accepted: Orders

## 2017-07-13 NOTE — Addendum Note (Signed)
Addended by: Gerome ApleyZEYFANG, Mitali [place] L on: 07/13/2017 09:04 AM   Modules accepted: Orders

## 2017-07-14 LAB — POCT PREGNANCY, URINE: PREG TEST UR: NEGATIVE

## 2017-07-14 LAB — BETA HCG QUANT (REF LAB): hCG Quant: <1 m[IU]/mL

## 2017-08-17 ENCOUNTER — Ambulatory Visit (INDEPENDENT_AMBULATORY_CARE_PROVIDER_SITE_OTHER): Payer: Medicaid Other | Admitting: Obstetrics & Gynecology

## 2017-08-17 ENCOUNTER — Other Ambulatory Visit (HOSPITAL_COMMUNITY)
Admission: RE | Admit: 2017-08-17 | Discharge: 2017-08-17 | Disposition: A | Discharge status: Home / Self Care | Payer: Medicaid Other | Source: Ambulatory Visit | Attending: Obstetrics & Gynecology | Admitting: Obstetrics & Gynecology

## 2017-08-17 ENCOUNTER — Encounter: Payer: Self-pay | Admitting: Obstetrics & Gynecology

## 2017-08-17 VITALS — BP 135/84 | HR 78 | Wt 140.4 lb

## 2017-08-17 DIAGNOSIS — Z01419 Encounter for gynecological examination (general) (routine) without abnormal findings: Secondary | ICD-10-CM | POA: Diagnosis not present

## 2017-08-17 DIAGNOSIS — N898 Other specified noninflammatory disorders of vagina: Secondary | ICD-10-CM

## 2017-08-17 DIAGNOSIS — N760 Acute vaginitis: Secondary | ICD-10-CM | POA: Diagnosis not present

## 2017-08-17 DIAGNOSIS — Z0000 Encounter for general adult medical examination without abnormal findings: Secondary | ICD-10-CM | POA: Diagnosis not present

## 2017-08-17 DIAGNOSIS — B9689 Other specified bacterial agents as the cause of diseases classified elsewhere: Secondary | ICD-10-CM | POA: Diagnosis not present
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
## 2017-08-21 ENCOUNTER — Telehealth: Payer: Self-pay | Admitting: *Deleted

## 2017-08-21 DIAGNOSIS — B9689 Other specified bacterial agents as the cause of diseases classified elsewhere: Secondary | ICD-10-CM

## 2017-08-21 DIAGNOSIS — N760 Acute vaginitis: Principal | ICD-10-CM

## 2017-08-21 LAB — CYTOLOGY - PAP
Bacterial vaginitis: POSITIVE — AB
CANDIDA VAGINITIS: NEGATIVE
Chlamydia: NEGATIVE
Diagnosis: NEGATIVE
Neisseria Gonorrhea: NEGATIVE
TRICH (WINDOWPATH): NEGATIVE

## 2017-08-21 MED ORDER — METRONIDAZOLE 500 MG PO TABS: 500.0000 mg | ORAL_TABLET | Freq: Two times a day (BID) | ORAL | 0 refills | Status: DC

## 2017-08-21 NOTE — Telephone Encounter (Signed)
-----   Message from Allie BossierMyra C Dove, MD sent at 08/21/2017  3:43 PM EDT ----- Please treat her for bv with flagyl.

## 2017-08-21 NOTE — Telephone Encounter (Signed)
Patient return called to nurse. Advised medication was sent to her pharmacy for BV.  Clovis PuMartin, Ilayda Toda L, RN

## 2017-08-21 NOTE — Telephone Encounter (Signed)
Left patient a voice message to call nurse back regarding test result. Pt being treated for BV per Dr. Marice Potterove. Metronidazole 500 mg 1 tab PO BID x 7 days, [facility] ref[facility].  Clovis PuMartin, Tamika L, RN

## 2017-09-29 ENCOUNTER — Encounter (HOSPITAL_COMMUNITY): Payer: Self-pay

## 2017-09-29 ENCOUNTER — Ambulatory Visit (INDEPENDENT_AMBULATORY_CARE_PROVIDER_SITE_OTHER): Payer: Medicaid Other

## 2017-09-29 ENCOUNTER — Ambulatory Visit (HOSPITAL_COMMUNITY)
Admission: EM | Admit: 2017-09-29 | Discharge: 2017-09-29 | Discharge status: Against Medical Advice | Payer: Medicaid Other

## 2017-09-29 ENCOUNTER — Ambulatory Visit (HOSPITAL_COMMUNITY)
Admission: EM | Admit: 2017-09-29 | Discharge: 2017-09-29 | Disposition: A | Discharge status: Home / Self Care | Payer: Medicaid Other | Attending: Urgent Care | Admitting: Urgent Care

## 2017-09-29 DIAGNOSIS — M79675 Pain in left toe(s): Secondary | ICD-10-CM

## 2017-09-29 DIAGNOSIS — S90112A Contusion of left great toe without damage to nail, initial encounter: Secondary | ICD-10-CM

## 2017-09-29 DIAGNOSIS — S9032XA Contusion of left foot, initial encounter: Secondary | ICD-10-CM

## 2017-09-29 DIAGNOSIS — S92425A Nondisplaced fracture of distal phalanx of left great toe, initial encounter for closed fracture: Secondary | ICD-10-CM

## 2017-09-29 LAB — POCT PREGNANCY, URINE: PREG TEST UR: NEGATIVE

## 2017-09-29 NOTE — ED Provider Notes (Signed)
MRN: [id number] DOB: [birth year]  Subjective:   Tonya Gonzales is a 27 y.o. female presenting for left foot toe injury.  Patient states that she dropped a couch on her left foot and made impact with her first 3 toes.  She has had some persistent pain since then.  Has been icing her foot all day today.  Denies bruising, redness but has some slight swelling.  reports that she has quit smoking. Her smoking use included cigarettes. She smoked 0.50 packs per day. She has never used smokeless tobacco. She reports that she drinks alcohol. She reports that she has current or past drug history. Drug: Marijuana. Frequency: 7.00 times per week.   No current facility-administered medications for this encounter.   Current Outpatient Medications:  .  etonogestrel (NEXPLANON) 68 MG IMPL implant, 1 each by Subdermal route once., Disp: , Rfl:  .  metroNIDAZOLE (FLAGYL) 500 MG tablet, Take 1 tablet (500 mg total) by mouth 2 (two) times daily., Disp: 14 tablet, Rfl: 0   Allergies  Allergen Reactions  . Ibuprofen Anaphylaxis    Sore throat associated.   . Other Anaphylaxis    All fruits cause sore throat   . Latex Hives and Rash    "burns skin"  . Morphine And Related Hives and Rash    Past Medical History:  Diagnosis Date  . Asthma   . Herpes      Past Surgical History:  Procedure Laterality Date  . NO PAST SURGERIES      Objective:   Vitals: BP 111/79   Pulse 92   Temp 98.2 F (36.8 C)   Resp 18   LMP 07/29/2017   SpO2 96%   Physical Exam  Constitutional: She is oriented to person, place, and time. She appears well-developed and well-nourished.  Cardiovascular: Normal rate.  Pulmonary/Chest: Effort normal.  Musculoskeletal:       Left foot: There is decreased range of motion (secondary to pain), tenderness (mild over area depicted) and swelling (trace). There is normal capillary refill, no crepitus, no deformity and no laceration.       Feet:  Neurological: She is alert and oriented  to person, place, and time.    Results for orders placed or performed during the hospital encounter of 09/29/17 (from the past 24 hour(s))  Pregnancy, urine POC     Status: None   Collection Time: 09/29/17 12:48 PM  Result Value Ref Range   Preg Test, Ur NEGATIVE NEGATIVE   Dg Foot Complete Left  Result Date: 09/29/2017 CLINICAL DATA:  Dropped heavy object on LEFT foot, with pain in the great toe. EXAM: LEFT FOOT - COMPLETE 3+ VIEW COMPARISON:  None. FINDINGS: Seen only on the lateral view, there is concern for a nondisplaced fracture of the distal tuft of the great toe. There may be mild soft tissue swelling. No transverse fracture through the distal phalanx or interphalangeal joint disruption. Remainder of the bones of the foot are intact. IMPRESSION: Seen only on lateral view, there is concern for nondisplaced fracture of the distal tuft of the great toe. This is marked with arrows. Electronically Signed   By: Elsie Stain M.D.   On: 09/29/2017 13:16   Assessment and Plan :   Closed nondisplaced fracture of distal phalanx of left great toe, initial encounter  Contusion of left foot, initial encounter  Contusion of left great toe without damage to nail, initial encounter  Great toe pain, left  Counseled patient on management of left great toe  fracture which includes buddy system, icing for the first few days.  Patient cannot take NSAIDs due to allergic reaction and she is not interested in Tylenol due to sleepiness with this.  Provide her with a work note which patient was reluctant to use.  Recommended that she is a hard so she will and follow-up with her PCP to obtain a referral to orthopedist. Return-to-clinic precautions discussed, patient verbalized understanding.     Wallis Bamberg, PA-C 09/29/17 1350

## 2017-09-29 NOTE — ED Triage Notes (Signed)
Pt presents with pain in left greater toe after dropping a couch on it last night. Complaints of pain and swelling.

## 2017-09-29 NOTE — ED Notes (Signed)
Bed: UC01 Expected date:  Expected time:  Means of arrival:  Comments: 

## 2017-11-13 ENCOUNTER — Encounter (HOSPITAL_COMMUNITY): Payer: Self-pay | Admitting: Emergency Medicine

## 2017-11-13 ENCOUNTER — Ambulatory Visit (HOSPITAL_COMMUNITY)
Admission: EM | Admit: 2017-11-13 | Discharge: 2017-11-13 | Disposition: A | Discharge status: Home / Self Care | Payer: Medicaid Other | Attending: Family Medicine | Admitting: Family Medicine

## 2017-11-13 DIAGNOSIS — S90222A Contusion of left lesser toe(s) with damage to nail, initial encounter: Secondary | ICD-10-CM

## 2017-11-13 NOTE — ED Provider Notes (Signed)
[facility]    CSN: [id number] Arrival date & time: 11/13/17  1232     History   Chief Complaint Chief Complaint  Patient presents with  . Foot Pain    HPI Tonya Gonzales is a 27 y.o. female.   Is a 27 year old female presents with left great toenail pain.  This started approximately a week ago.  She had injury to her left great toe with fracture approximately 2 months ago when a couch has felt her foot.  The injury has since healed.  She took her toenail polish off of the left great toe and noticed some discoloration with pain to touch.  Reports it is gotten slightly better over the past week but still painful.  She denies any new injuries to the foot.  She denies any fever, chills.  She denies any numbness, tingling or loss of sensation.  She is requesting a new postop boot to help with discomfort.  ROS per HPI      Past Medical History:  Diagnosis Date  . Asthma   . Herpes     Patient Active Problem List   Diagnosis Date Noted  . 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 Tobacco abuse 05/09/2017  . Nexplanon in place 05/09/2017    Past Surgical History:  Procedure Laterality Date  . NO PAST SURGERIES      OB History    Gravida  3   Para  2   Term  2   Preterm      AB  1   Living  2     SAB      TAB      Ectopic  1   Multiple      Live Births           Obstetric Comments  Ectopic treated with MTX         Home Medications    Prior to Admission medications   Medication Sig Start Date End Date Taking? Authorizing Provider  etonogestrel (NEXPLANON) 68 MG IMPL implant 1 each by Subdermal route once.    [provider]  metroNIDAZOLE (FLAGYL) 500 MG tablet Take 1 tablet (500 mg total) by mouth 2 (two) times daily. Patient not taking: Reported on 11/13/2017 08/21/17   Allie Bossier, MD    Family History Family History  Problem Relation Age of Onset  . Hypertension Mother   . Diabetes Mother   . Diabetes Maternal  Grandmother     Social History Social History   Tobacco Use  . Smoking status: Former Smoker    Packs/day: 0.50    Types: Cigarettes  . Smokeless tobacco: Never Used  Substance Use Topics  . Alcohol use: Yes    Comment: RARE  . Drug use: Yes    Frequency: 7.0 times per week    Types: Marijuana     Allergies   Ibuprofen; Other; Latex; and Morphine and related   Review of Systems Review of Systems   Physical Exam Triage Vital Signs ED Triage Vitals  Enc Vitals Group     BP 11/13/17 1316 136/74     Pulse Rate 11/13/17 1316 81     Resp 11/13/17 1316 18     Temp 11/13/17 1316 98.4 F (36.9 C)     Temp Source 11/13/17 1316 Oral     SpO2 11/13/17 1316 100 %     Weight --      Height --      Head Circumference --  Peak Flow --      Pain Score 11/13/17 1317 6     Pain Loc --      Pain Edu? --      Excl. in GC? --    No data found.  Updated Vital Signs BP 136/74 (BP Location: Left Arm)   Pulse 81   Temp 98.4 F (36.9 C) (Oral)   Resp 18   SpO2 100%   Visual Acuity Right Eye Distance:   Left Eye Distance:   Bilateral Distance:    Right Eye Near:   Left Eye Near:    Bilateral Near:     Physical Exam  Constitutional: She appears well-developed and well-nourished.  Very pleasant. Non toxic or ill appearing.   HENT:  Head: Atraumatic.  Eyes: Conjunctivae are normal.  Neck: Normal range of motion.  Pulmonary/Chest: Effort normal.  Musculoskeletal: Normal range of motion.  Neurological: She is alert.  Skin: Skin is warm and dry.  Subungual hematoma, mild, to left great toe.  Mildly tender to palpation.   Psychiatric: She has a normal mood and affect.  Vitals reviewed.    UC Treatments / Results  Labs (all labs ordered are listed, but only abnormal results are displayed) Labs Reviewed - No data to display  EKG None  Radiology No results found.  Procedures Procedures (including critical care time)  Medications Ordered in  UC Medications - No data to display  Initial Impression / Assessment and Plan / UC Course  I have reviewed the triage vital signs and the nursing notes.  Pertinent labs & imaging results that were available during my care of the patient were reviewed by me and considered in my medical decision making (see chart for details).     Evacuation of subungual hematoma not warranted today.  Minimal amount of bleeding in the nail.  Plus patient is not agreeing with procedure. New postop shoe given to help with comfort Follow up as needed for continued or worsening symptoms  Final Clinical Impressions(s) / UC Diagnoses   Final diagnoses:  Subungual hematoma of toenail of left foot, initial encounter     Discharge Instructions     It was nice meeting you!!  We will give you another postop shoe to help with comfort Monitor for worsening symptoms    ED Prescriptions    None     Controlled Substance Prescriptions [place] Controlled Substance Registry consulted? Not Applicable   Janace Aris, NP 11/13/17 1415

## 2017-11-13 NOTE — ED Triage Notes (Signed)
Pt here for foot pain since September when couch fell on foot; pt seen here initially

## 2017-11-13 NOTE — Discharge Instructions (Addendum)
It was nice meeting you!!  We will give you another postop shoe to help with comfort Monitor for worsening symptoms

## 2017-11-28 ENCOUNTER — Ambulatory Visit (INDEPENDENT_AMBULATORY_CARE_PROVIDER_SITE_OTHER): Payer: Self-pay | Admitting: Orthopaedic Surgery

## 2017-12-06 ENCOUNTER — Ambulatory Visit (INDEPENDENT_AMBULATORY_CARE_PROVIDER_SITE_OTHER): Payer: Self-pay | Admitting: Orthopaedic Surgery

## 2018-01-12 ENCOUNTER — Encounter (HOSPITAL_COMMUNITY): Payer: Self-pay

## 2018-01-12 ENCOUNTER — Other Ambulatory Visit: Payer: Self-pay

## 2018-01-12 ENCOUNTER — Emergency Department (HOSPITAL_COMMUNITY)
Admission: EM | Admit: 2018-01-12 | Discharge: 2018-01-12 | Disposition: A | Discharge status: Home / Self Care | Payer: Medicaid Other | Attending: Emergency Medicine | Admitting: Emergency Medicine

## 2018-01-12 DIAGNOSIS — Z87891 Personal history of nicotine dependence: Secondary | ICD-10-CM | POA: Diagnosis not present

## 2018-01-12 DIAGNOSIS — K582 Mixed irritable bowel syndrome: Secondary | ICD-10-CM | POA: Diagnosis not present

## 2018-01-12 DIAGNOSIS — Z91040 Latex allergy status: Secondary | ICD-10-CM | POA: Insufficient documentation

## 2018-01-12 DIAGNOSIS — J45909 Unspecified asthma, uncomplicated: Secondary | ICD-10-CM | POA: Diagnosis not present

## 2018-01-12 DIAGNOSIS — K2900 Acute gastritis without bleeding: Secondary | ICD-10-CM | POA: Insufficient documentation

## 2018-01-12 DIAGNOSIS — B9689 Other specified bacterial agents as the cause of diseases classified elsewhere: Secondary | ICD-10-CM | POA: Insufficient documentation

## 2018-01-12 DIAGNOSIS — R1033 Periumbilical pain: Secondary | ICD-10-CM | POA: Diagnosis present

## 2018-01-12 DIAGNOSIS — N760 Acute vaginitis: Secondary | ICD-10-CM | POA: Diagnosis not present

## 2018-01-12 LAB — COMPREHENSIVE METABOLIC PANEL
ALT: 11 U/L (ref 0–44)
AST: 14 U/L — AB (ref 15–41)
Albumin: 4.1 g/dL (ref 3.5–5.0)
Alkaline Phosphatase: 50 U/L (ref 38–126)
Anion gap: 8 (ref 5–15)
BILIRUBIN TOTAL: 0.7 mg/dL (ref 0.3–1.2)
BUN: 5 mg/dL — AB (ref 6–20)
CO2: 26 mmol/L (ref 22–32)
CREATININE: 0.65 mg/dL (ref 0.44–1.00)
Calcium: 8.9 mg/dL (ref 8.9–10.3)
Chloride: 105 mmol/L (ref 98–111)
GFR calc Af Amer: >60 mL/min (ref >60)
Glucose, Bld: 94 mg/dL (ref 70–99)
Potassium: 3.5 mmol/L (ref 3.5–5.1)
Sodium: 139 mmol/L (ref 135–145)
TOTAL PROTEIN: 7.1 g/dL (ref 6.5–8.1)

## 2018-01-12 LAB — CBC WITH DIFFERENTIAL/PLATELET
ABS IMMATURE GRANULOCYTES: 0.01 10*3/uL (ref 0.00–0.07)
Basophils Absolute: 0 10*3/uL (ref 0.0–0.1)
Basophils Relative: 1 %
Eosinophils Absolute: 0.2 10*3/uL (ref 0.0–0.5)
Eosinophils Relative: 5 %
HEMATOCRIT: 37.3 % (ref 36.0–46.0)
Hemoglobin: 12 g/dL (ref 12.0–15.0)
IMMATURE GRANULOCYTES: 0 %
LYMPHS ABS: 1.9 10*3/uL (ref 0.7–4.0)
Lymphocytes Relative: 40 %
MCH: 29.6 pg (ref 26.0–34.0)
MCHC: 32.2 g/dL (ref 30.0–36.0)
MCV: 91.9 fL (ref 80.0–100.0)
MONOS PCT: 8 %
Monocytes Absolute: 0.4 10*3/uL (ref 0.1–1.0)
NEUTROS ABS: 2.1 10*3/uL (ref 1.7–7.7)
NEUTROS PCT: 46 %
Platelets: 215 10*3/uL (ref 150–400)
RBC: 4.06 MIL/uL (ref 3.87–5.11)
RDW: 12.5 % (ref 11.5–15.5)
WBC: 4.6 10*3/uL (ref 4.0–10.5)
nRBC: 0 % (ref 0.0–0.2)

## 2018-01-12 LAB — URINALYSIS, ROUTINE W REFLEX MICROSCOPIC
Bilirubin Urine: NEGATIVE
Glucose, UA: NEGATIVE mg/dL
KETONES UR: NEGATIVE mg/dL
Leukocytes, UA: NEGATIVE
Nitrite: NEGATIVE
PH: 7 (ref 5.0–8.0)
Protein, ur: NEGATIVE mg/dL
SPECIFIC GRAVITY, URINE: 1.012 (ref 1.005–1.030)

## 2018-01-12 LAB — POC URINE PREG, ED: PREG TEST UR: NEGATIVE

## 2018-01-12 LAB — LIPASE, BLOOD: LIPASE: 25 U/L (ref 11–51)

## 2018-01-12 LAB — WET PREP, GENITAL
SPERM: NONE SEEN
TRICH WET PREP: NONE SEEN
Yeast Wet Prep HPF POC: NONE SEEN

## 2018-01-12 MED ORDER — DICYCLOMINE HCL 10 MG PO CAPS
10.0000 mg | ORAL_CAPSULE | Freq: Once | ORAL | Status: AC
  Administered 2018-01-12: 10 mg via ORAL
  Filled 2018-01-12: qty 1

## 2018-01-12 MED ORDER — LIDOCAINE VISCOUS HCL 2 % MT SOLN
15.0000 mL | Freq: Once | OROMUCOSAL | Status: AC
  Administered 2018-01-12: 15 mL via ORAL
  Filled 2018-01-12: qty 15

## 2018-01-12 MED ORDER — DICYCLOMINE HCL 20 MG PO TABS: 20.0000 mg | ORAL_TABLET | Freq: Two times a day (BID) | ORAL | 0 refills | Status: DC

## 2018-01-12 MED ORDER — OMEPRAZOLE 20 MG PO CPDR: 20.0000 mg | DELAYED_RELEASE_CAPSULE | Freq: Every day | ORAL | 1 refills | Status: DC

## 2018-01-12 MED ORDER — ALUM & MAG HYDROXIDE-SIMETH 200-200-20 MG/5ML PO SUSP
30.0000 mL | Freq: Once | ORAL | Status: AC
  Administered 2018-01-12: 30 mL via ORAL
  Filled 2018-01-12: qty 30

## 2018-01-12 MED ORDER — METRONIDAZOLE 0.75 % VA GEL: 1.0000 | Freq: Two times a day (BID) | VAGINAL | 0 refills | Status: DC

## 2018-01-12 NOTE — ED Triage Notes (Addendum)
Pt states that she has had sharp pains in her epigastric area x 2 months. Pt states this feels different than her dx of IBS. Pt states that she has been taking BCs for foot pain. Pt states that the pain is worse after eating. Pt states that she has been unable to stand at times due to the pain.

## 2018-01-12 NOTE — ED Provider Notes (Signed)
[facility] Provider Note   CSN: [id number] Arrival date & time: 01/12/18  0349     History   Chief Complaint Chief Complaint  Patient presents with  . Abdominal Pain    HPI Tonya Gonzales is a 28 y.o. female.  The history is provided by the patient.  Abdominal Pain  Pain location:  Epigastric and periumbilical Pain quality: aching, bloating, cramping, dull, gnawing and sharp   Pain radiates to:  Does not radiate Pain severity:  Moderate Onset quality:  Gradual Duration: intermittent for months. Timing:  Intermittent Progression:  Worsening Chronicity:  New Context: not previous surgeries, not recent illness, not recent travel, not sick contacts, not suspicious food intake and not trauma   Context comment:  In sept started drinking a glass of wine a night but recently stopped due to making her stomach hurt.  also she has been taking BC powder for foot pain Relieved by:  None tried (unsure if anything makes it worse) Ineffective treatments:  None tried Associated symptoms: nausea and vaginal discharge   Associated symptoms: no chest pain, no cough, no dysuria, no fever, no shortness of breath and no vomiting   Associated symptoms comment:  Stools vary between constipation and diarrhea due to IBS.  Normal color stool.  Also in the last 1 week she has had a white thin vaginal discharge without any itching or burning.  She is currently sexually active with one partner for the last 3 years and does not use protection. Risk factors: no alcohol abuse, not obese and no recent hospitalization   Risk factors comment:  Prior hx of ectopic pregnany and currently on injection [facility]   Past Medical History:  Diagnosis Date  . Asthma   . Herpes     Patient Active Problem List   Diagnosis Date Noted  . 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 Tobacco abuse 05/09/2017  . Nexplanon in place 05/09/2017    Past Surgical History:  Procedure Laterality Date    . NO PAST SURGERIES       OB History    Gravida  3   Para  2   Term  2   Preterm      AB  1   Living  2     SAB      TAB      Ectopic  1   Multiple      Live Births           Obstetric Comments  Ectopic treated with MTX         Home Medications    Prior to Admission medications   Medication Sig Start Date End Date Taking? Authorizing Provider  Aspirin-Caffeine (BC FAST PAIN RELIEF PO) Take 1 Package by mouth as needed (headache).   Yes [provider]  etonogestrel (NEXPLANON) 68 MG IMPL implant 1 each by Subdermal route once.   Yes [provider]  Pseudoephedrine-APAP-DM 17-915-05 MG/15ML LIQD Take 10 mLs by mouth every 8 (eight) hours as needed (congestion syptoms).   Yes [provider]  metroNIDAZOLE (FLAGYL) 500 MG tablet Take 1 tablet (500 mg total) by mouth 2 (two) times daily. Patient not taking: Reported on 11/13/2017 08/21/17   Allie Bossier, MD    Family History Family History  Problem Relation Age of Onset  . Hypertension Mother   . Diabetes Mother   . Diabetes Maternal Grandmother     Social History Social History   Tobacco Use  .  Smoking status: Former Smoker    Packs/day: 0.50    Types: Cigarettes  . Smokeless tobacco: Never Used  Substance Use Topics  . Alcohol use: Yes    Comment: RARE  . Drug use: Yes    Frequency: 7.0 times per week    Types: Marijuana     Allergies   Ibuprofen; Other; Latex; and Morphine and related   Review of Systems Review of Systems  Constitutional: Negative for fever.  Respiratory: Negative for cough and shortness of breath.   Cardiovascular: Negative for chest pain.  Gastrointestinal: Positive for abdominal pain and nausea. Negative for vomiting.  Genitourinary: Positive for vaginal discharge. Negative for dysuria.  All other systems reviewed and are negative.    Physical Exam Updated Vital Signs BP (!) 121/92 (BP Location: Left Arm)   Pulse (!) 110   Temp  98.3 F (36.8 C) (Oral)   Resp 16   Ht 5\' 3"  (1.6 m)   Wt 74.8 kg   SpO2 99%   BMI 29.23 kg/m   Physical Exam Vitals signs and nursing note reviewed.  Constitutional:      General: She is not in acute distress.    Appearance: She is well-developed.  HENT:     Head: Normocephalic and atraumatic.  Eyes:     Conjunctiva/sclera: Conjunctivae normal.     Pupils: Pupils are equal, round, and reactive to light.  Neck:     Musculoskeletal: Normal range of motion and neck supple.  Cardiovascular:     Rate and Rhythm: Normal rate and regular rhythm.     Heart sounds: No murmur.  Pulmonary:     Effort: Pulmonary effort is normal. No respiratory distress.     Breath sounds: Normal breath sounds. No wheezing or rales.  Abdominal:     General: There is no distension.     Palpations: Abdomen is soft.     Tenderness: There is abdominal tenderness in the epigastric area, periumbilical area and suprapubic area. There is no right CVA tenderness, left CVA tenderness, guarding or rebound.  Genitourinary:    Vagina: No signs of injury and foreign body. Vaginal discharge present. No tenderness.     Cervix: Normal.     Uterus: Normal.      Adnexa: Right adnexa normal and left adnexa normal.       Right: No mass or tenderness.         Left: No mass or tenderness.    Musculoskeletal: Normal range of motion.        General: No tenderness.  Skin:    General: Skin is warm and dry.     Findings: No erythema or rash.  Neurological:     Mental Status: She is alert and oriented to person, place, and time.  Psychiatric:        Behavior: Behavior normal.      ED Treatments / Results  Labs (all labs ordered are listed, but only abnormal results are displayed) Labs Reviewed  WET PREP, GENITAL - Abnormal; Notable for the following components:      Result Value   Clue Cells Wet Prep HPF POC PRESENT (*)    WBC, Wet Prep HPF POC MANY (*)    All other components within normal limits  COMPREHENSIVE  METABOLIC PANEL - Abnormal; Notable for the following components:   BUN 5 (*)    AST 14 (*)    All other components within normal limits  URINALYSIS, ROUTINE W REFLEX MICROSCOPIC - Abnormal; Notable for  the following components:   Hgb urine dipstick MODERATE (*)    Bacteria, UA RARE (*)    All other components within normal limits  CBC WITH DIFFERENTIAL/PLATELET  LIPASE, BLOOD  POC URINE PREG, ED  GC/CHLAMYDIA PROBE AMP ([place]    EKG None  Radiology No results found.  Procedures Procedures (including critical care time)  Medications Ordered in ED Medications  alum & mag hydroxide-simeth (MAALOX/MYLANTA) 200-200-20 MG/5ML suspension 30 mL (has no administration in time range)    And  lidocaine (XYLOCAINE) 2 % viscous mouth solution 15 mL (has no administration in time range)     Initial Impression / Assessment and Plan / ED Course  I have reviewed the triage vital signs and the nursing notes.  Pertinent labs & imaging results that were available during my care of the patient were reviewed by me and considered in my medical decision making (see chart for details).     Patient presenting today with 2 separate concerns.  Patient is complaining of several months of epigastric and periumbilical discomfort in the setting of taking BC powders and drinking a glass of wine daily.  Patient has mild epigastric tenderness on exam and concern for possible peptic ulcer disease.  Discussed with her stopping BC powders as well as avoiding any type of ibuprofen or aspirin products.  She has stopped drinking wine because it was making her feel sick.  CBC, CMP and lipase to evaluate for anemia, hepatitis or pancreatitis.  Secondly patient has had 1 week of vaginal discharge and lower abdominal discomfort.  She has low risk for STI as she is only sexually active with one partner for the last 3 years.  She denies any dysuria or symptoms suggestive of a UTI.  We will do a pelvic  exam to further evaluate. Patient's vital signs show tachycardia of 110 but otherwise within normal limits.  She is well-appearing on exam.  She was given GI cocktail.  1:23 PM After GI cocktail patient states her abdomen is still uncomfortable the but the pain is moving around.  Patient symptoms may be more related to IBS.  Her pelvic exam was benign without symptoms concerning for PID.  Wet prep is positive for bacterial vaginosis which is probably what is causing her discharge and smell.  Patient's CMP, CBC, urine pregnancy test are all within normal limits.  UA without signs of UTI.  Patient treated with MetroGel, Bentyl and omeprazole.  Encouraged to follow-up with PCP.  Final Clinical Impressions(s) / ED Diagnoses   Final diagnoses:  Bacterial vaginosis  Acute gastritis without hemorrhage, unspecified gastritis type  Irritable bowel syndrome with both constipation and diarrhea    ED Discharge Orders         Ordered    metroNIDAZOLE (METROGEL VAGINAL) 0.75 % vaginal gel  2 times daily     01/12/18 1322    dicyclomine (BENTYL) 20 MG tablet  2 times daily     01/12/18 1322    omeprazole (PRILOSEC) 20 MG capsule  Daily     01/12/18 1322           Gwyneth SproutPlunkett, Srishti Strnad, MD 01/12/18 1324

## 2018-01-12 NOTE — ED Notes (Signed)
RX X 3 PICK UP

## 2018-01-15 LAB — GC/CHLAMYDIA PROBE AMP (~~LOC~~) NOT AT ARMC
Chlamydia: NEGATIVE
Neisseria Gonorrhea: NEGATIVE

## 2018-07-28 ENCOUNTER — Other Ambulatory Visit: Payer: Self-pay

## 2018-07-28 ENCOUNTER — Encounter (HOSPITAL_COMMUNITY): Payer: Self-pay | Admitting: Emergency Medicine

## 2018-07-28 ENCOUNTER — Ambulatory Visit (HOSPITAL_COMMUNITY)
Admission: EM | Admit: 2018-07-28 | Discharge: 2018-07-28 | Disposition: A | Discharge status: Home / Self Care | Payer: Medicaid Other | Attending: Family Medicine | Admitting: Family Medicine

## 2018-07-28 DIAGNOSIS — S161XXA Strain of muscle, fascia and tendon at neck level, initial encounter: Secondary | ICD-10-CM

## 2018-07-28 MED ORDER — CYCLOBENZAPRINE HCL 5 MG PO TABS: 5.0000 mg | ORAL_TABLET | Freq: Two times a day (BID) | ORAL | 0 refills | Status: DC | PRN
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
## 2018-07-28 NOTE — Discharge Instructions (Addendum)
Alternative heat application is use of rice in a warm towel applied directly to sight of injury in 20 minutes increments. If symptom
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
## 2018-08-11 IMAGING — US US OB TRANSVAGINAL
1 series · 14 of 28 positions shown · non-contrast
Comparison: None.

CLINICAL DATA: Pregnant patient with pelvic pain.

EXAM:
OBSTETRIC <14 WK US AND TRANSVAGINAL OB US
TECHNIQUE: Both transabdominal and transvaginal ultrasound examinations were
performed for complete evaluation of the gestation as well as the
maternal uterus, adnexal regions, and pelvic cul-de-sac.
Transvaginal technique was performed to assess early pregnancy.

[Series 1: us ob transvaginal · 0.19mm/px · 14 of 66 slices shown]
[im 3/66]
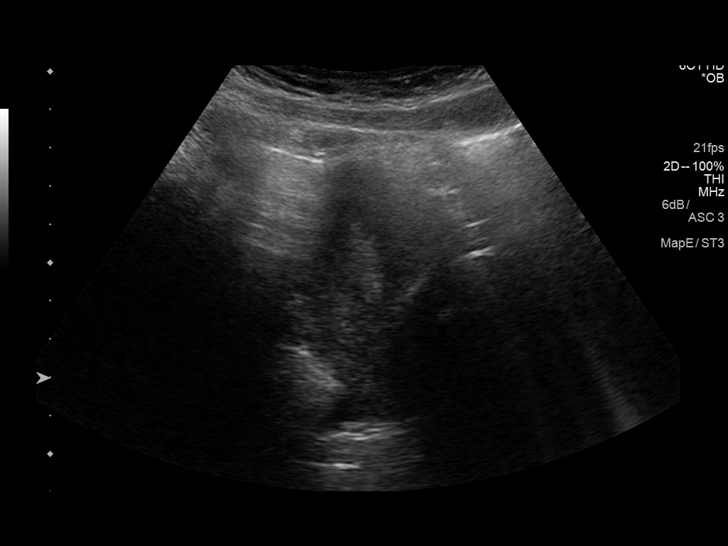
[im 8/66]
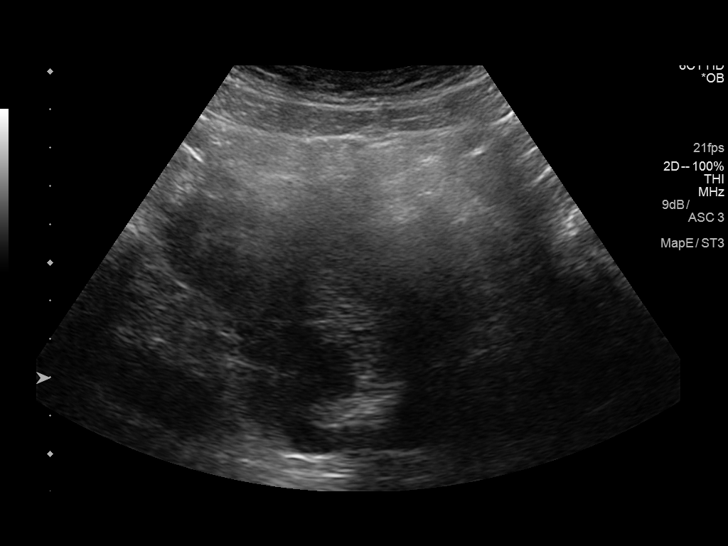
[im 13/66]
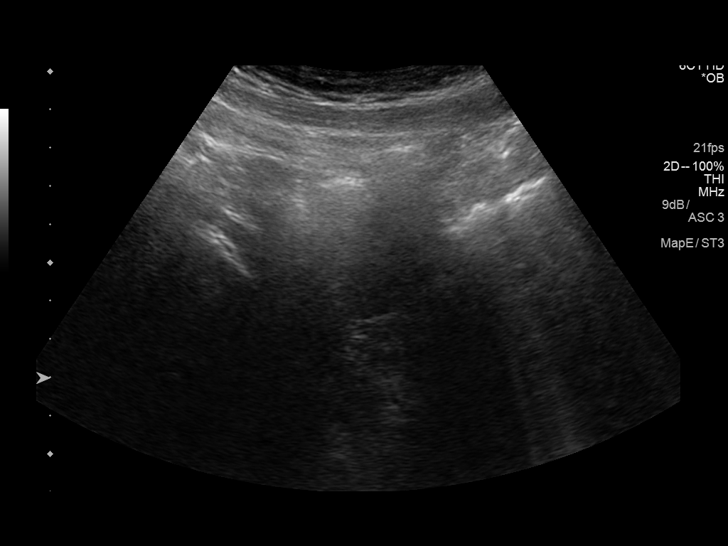
[im 17/66]
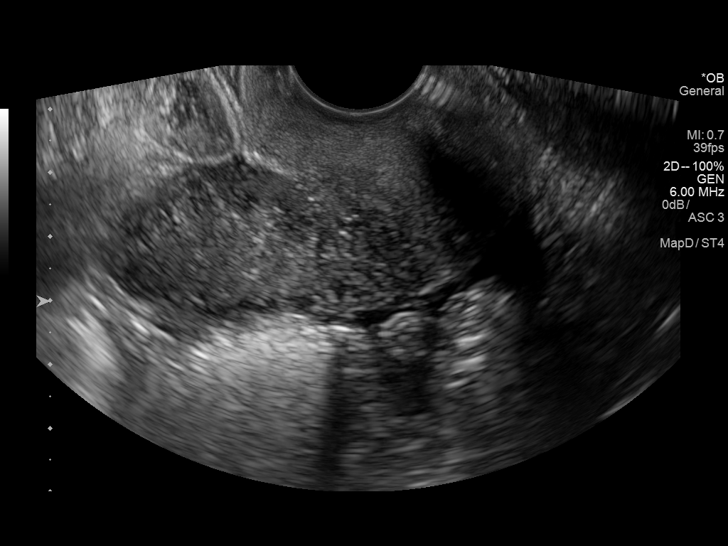
[im 22/66]
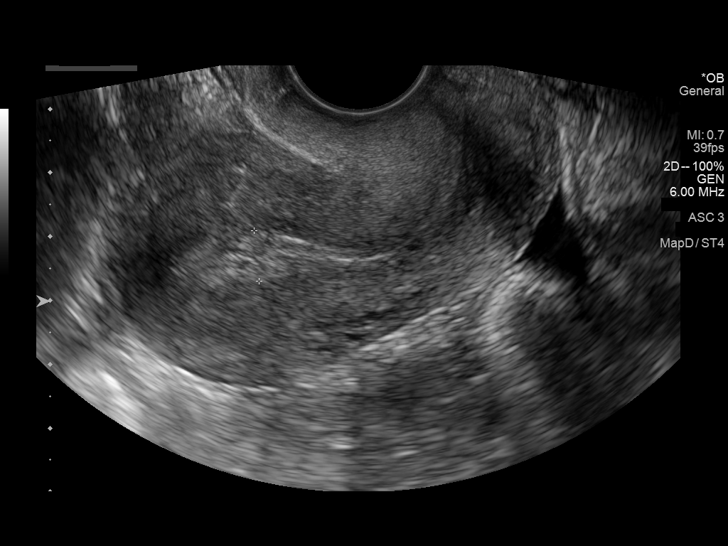
[im 27/66]
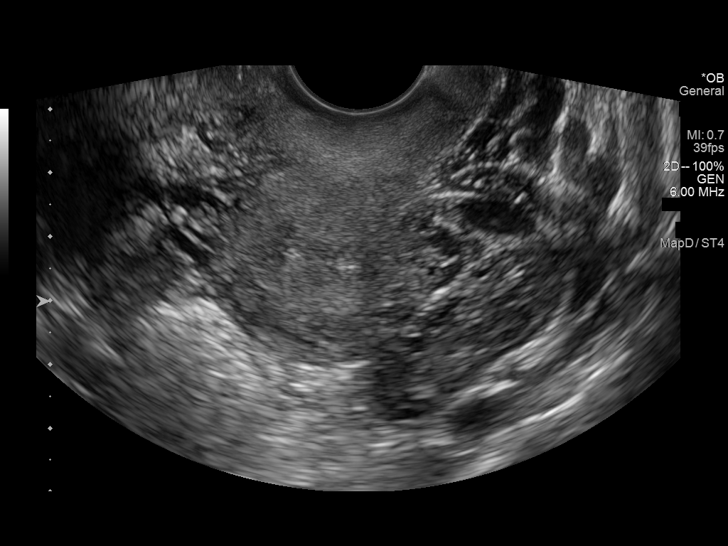
[im 32/66]
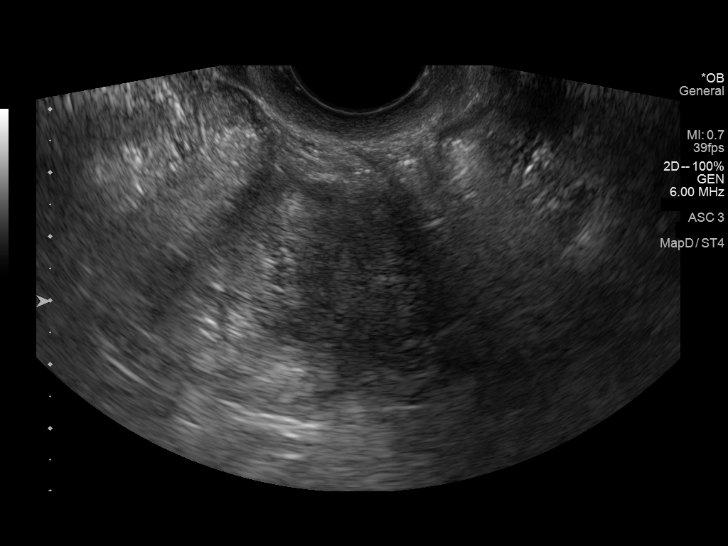
[im 37/66]
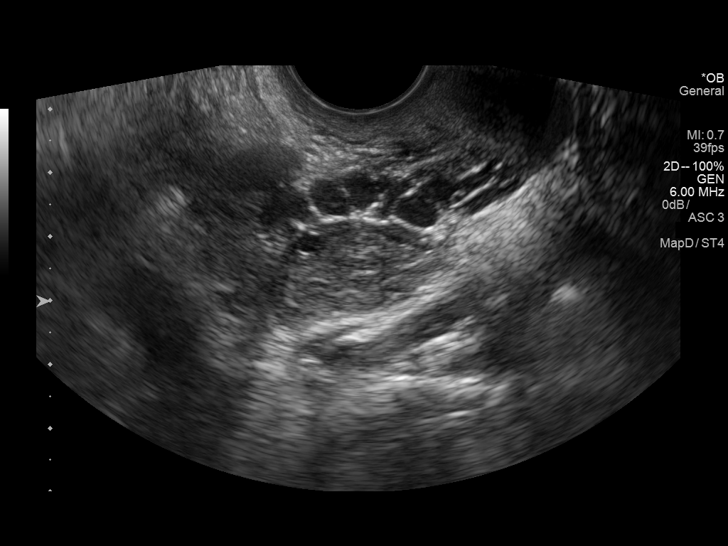
[im 41/66]
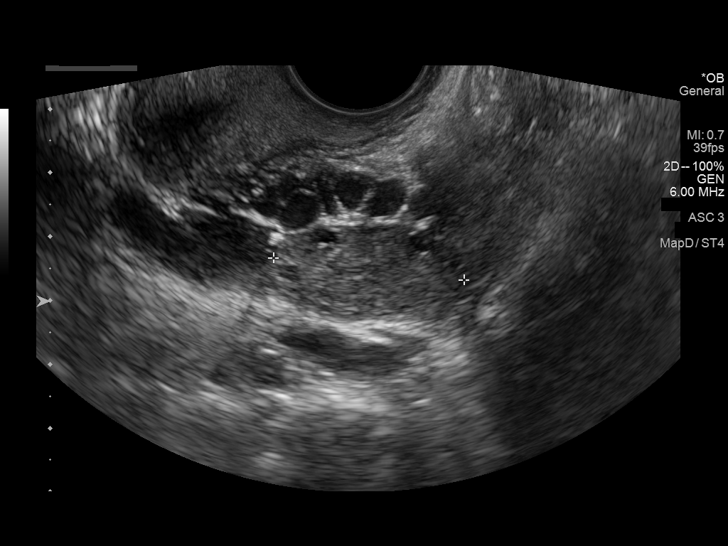
[im 46/66]
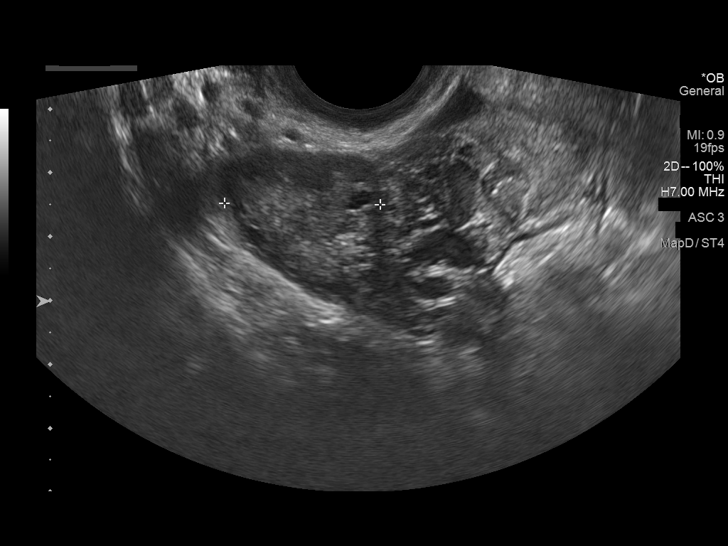
[im 51/66]
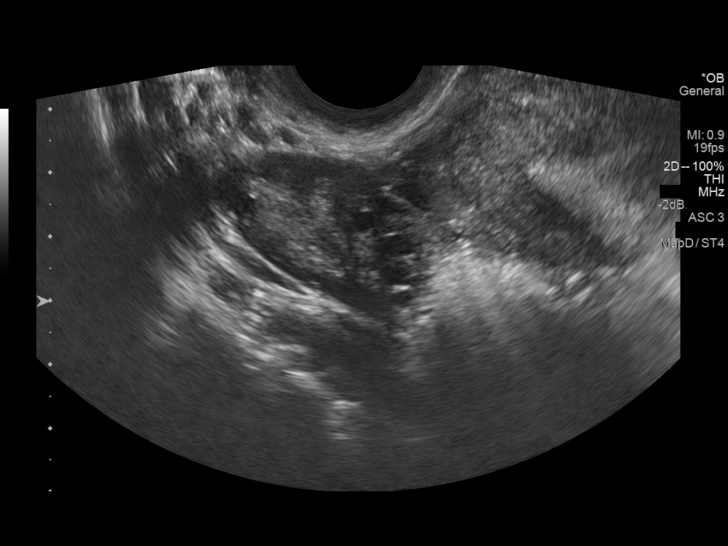
[im 56/66]
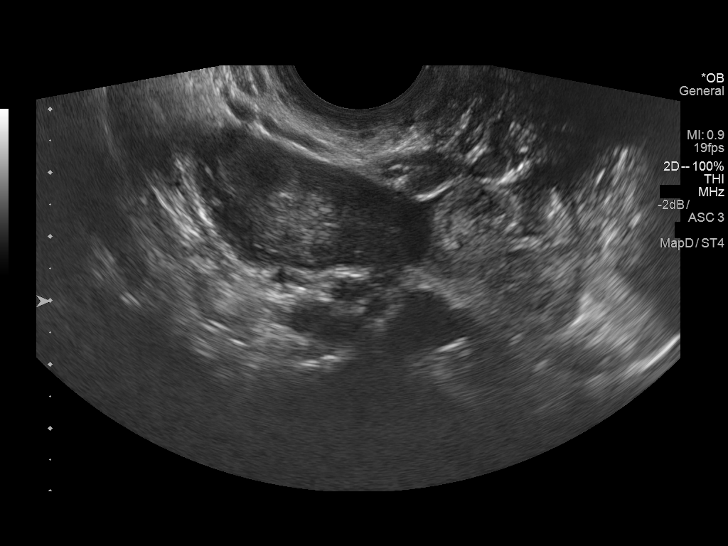
[im 61/66]
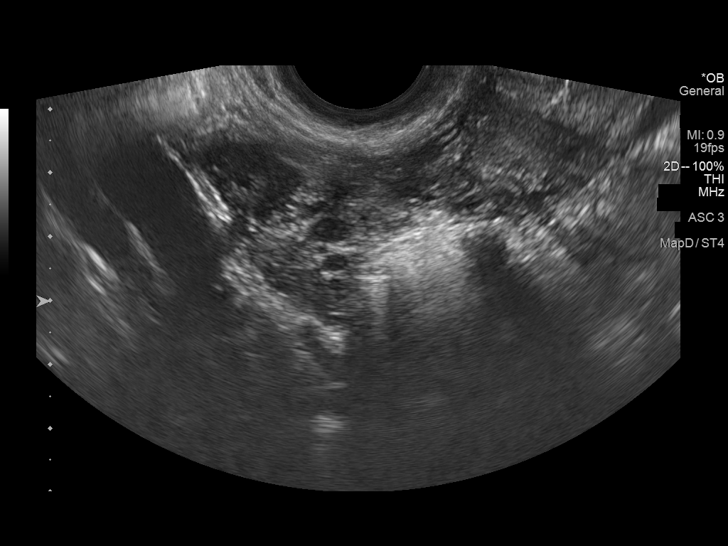
[im 66/66]
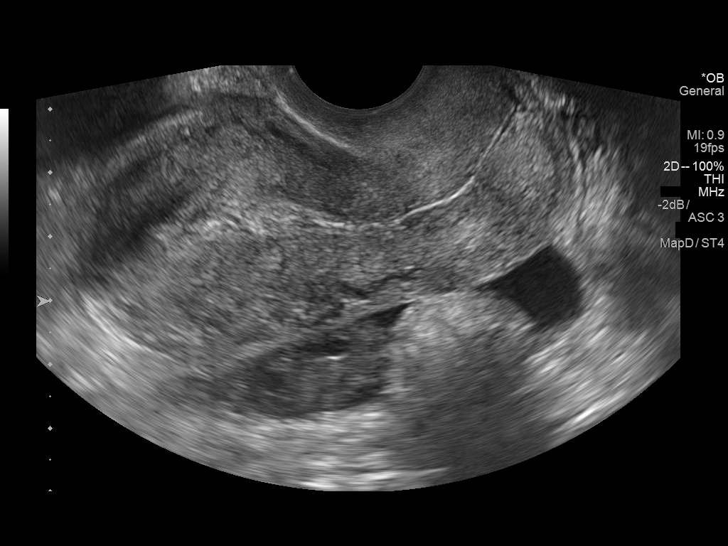

[14 of 28 positions shown; findings below may reference images not displayed]

FINDINGS: Intrauterine gestational sac: None

Yolk sac:  Not Visualized.

Embryo:  Not Visualized.

Cardiac Activity: Not Visualized.

Maternal uterus/adnexae: Normal right and left ovaries. No adnexal
mass identified. Small amount of fluid in the pelvis.
IMPRESSION: No intrauterine gestation identified. In the setting of positive
pregnancy test and no definite intrauterine pregnancy, this reflects
a pregnancy of unknown location. Differential considerations include
early normal IUP, abnormal IUP, or nonvisualized ectopic pregnancy.
Differentiation is achieved with serial beta HCG supplemented by
repeat sonography as clinically warranted.

## 2018-12-03 IMAGING — US US OB TRANSVAGINAL
1 series · 15 of 28 positions shown · non-contrast
Comparison: 05/04/2017 pelvic ultrasound

CLINICAL DATA: Pelvic pain and bleeding. Serial beta HCG ranging
from 519-618, the most recent on 05/08/2017 at 590.

EXAM:
TRANSVAGINAL OB ULTRASOUND
TECHNIQUE: Transvaginal ultrasound was performed for complete evaluation of the
gestation as well as the maternal uterus, adnexal regions, and
pelvic cul-de-sac.

[Series 1: us ob transvaginal · 15 of 50 slices shown]
[im 1/50]
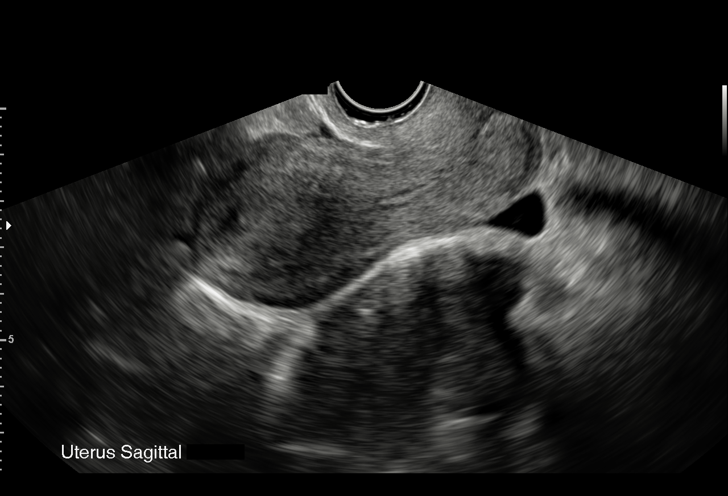
[im 4/50]
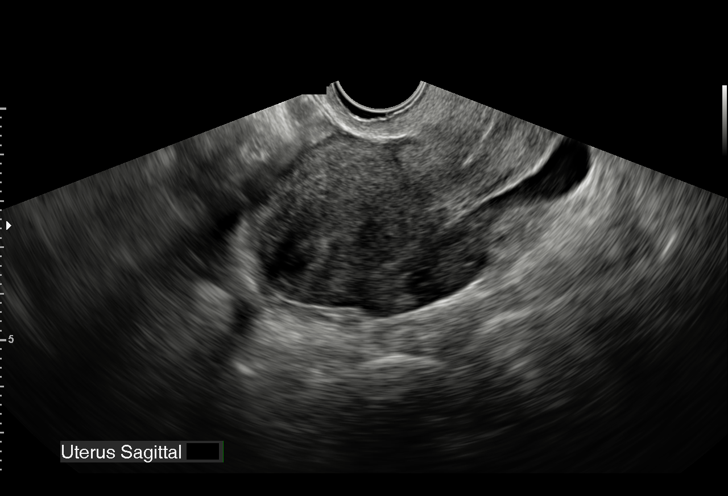
[im 8/50]
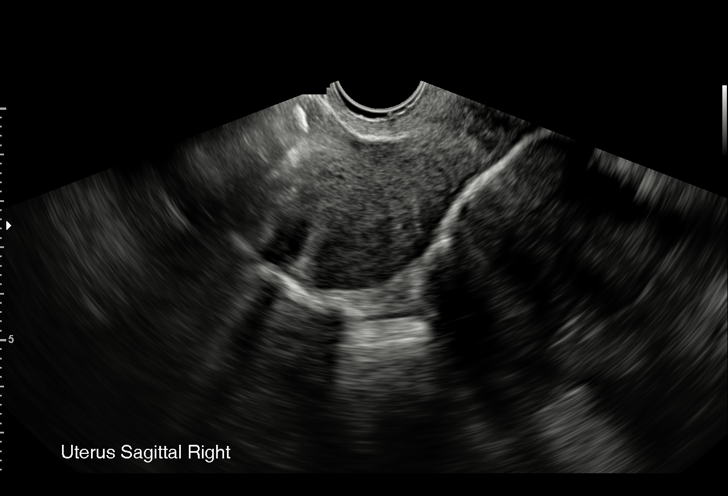
[im 11/50]
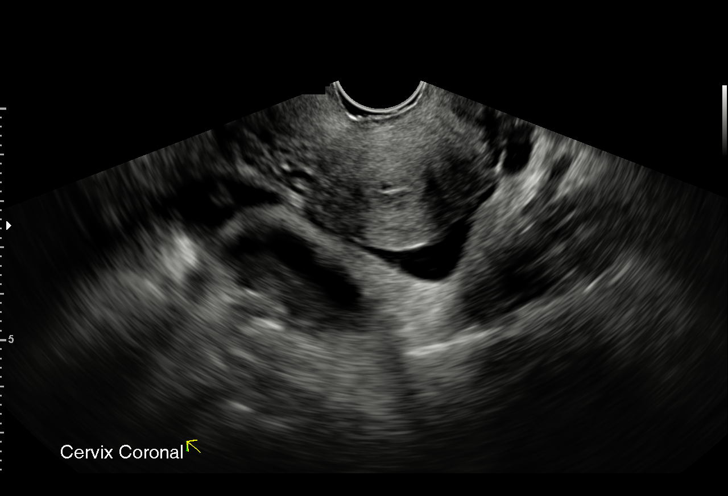
[im 15/50]
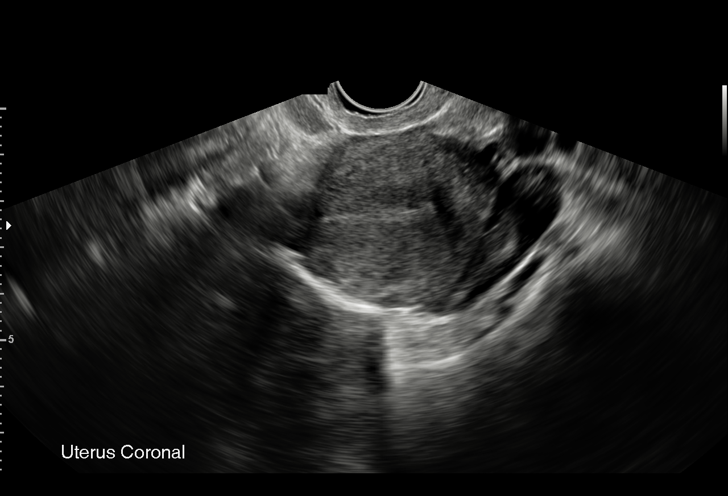
[im 19/50]
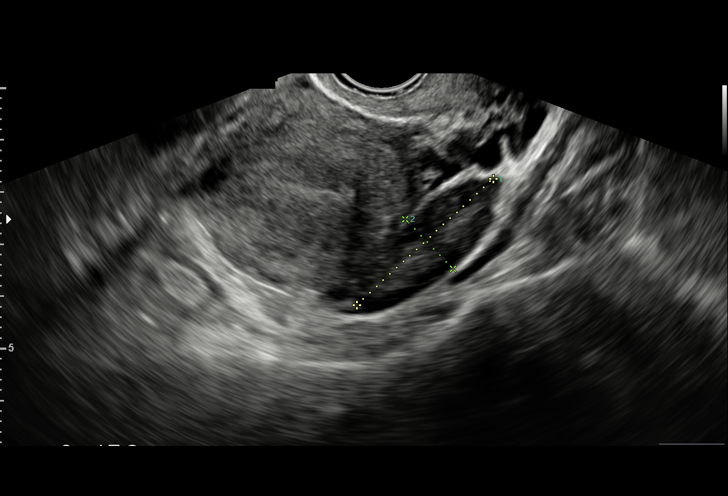
[im 22/50]
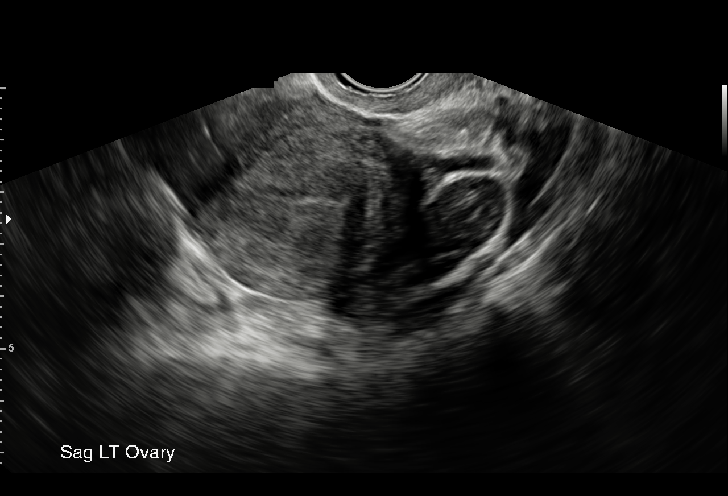
[im 26/50]
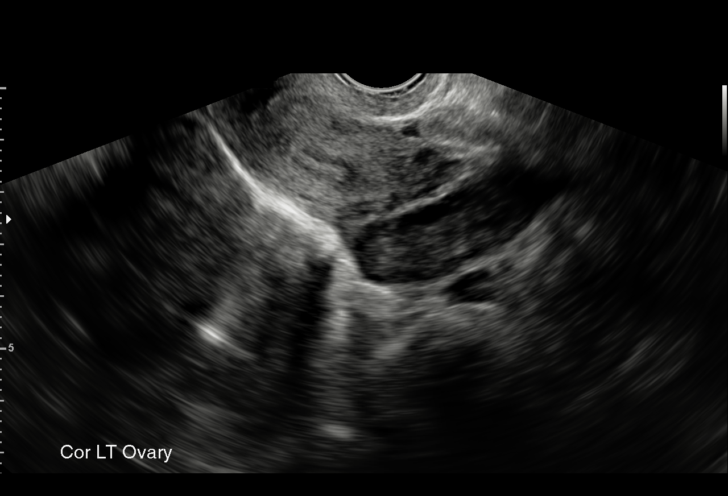
[im 28/50]
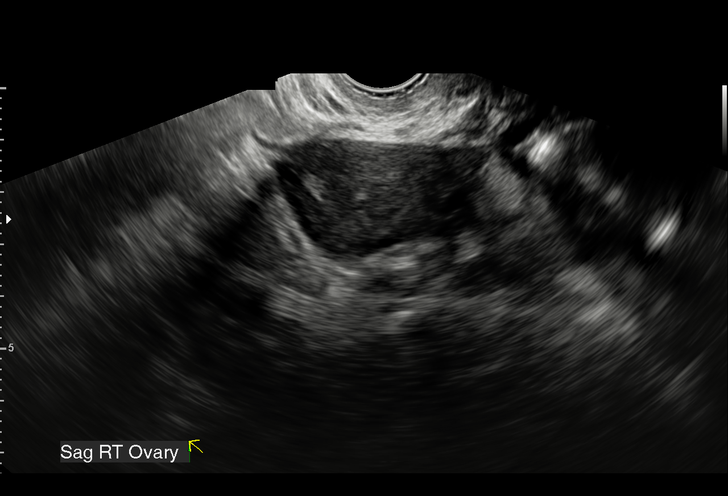
[im 31/50]
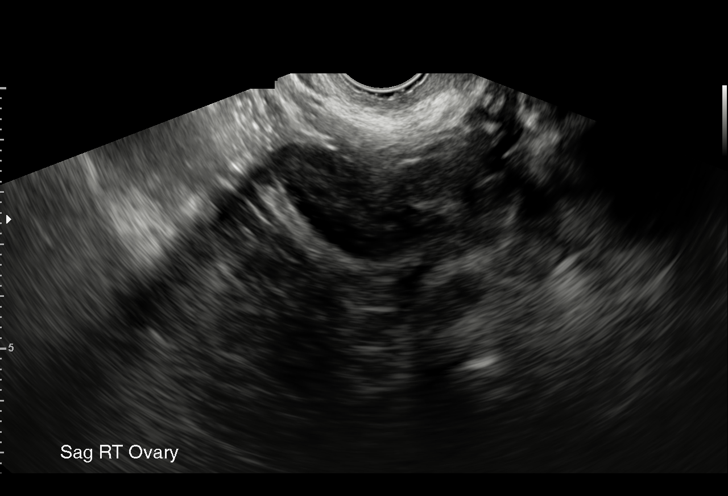
[im 35/50]
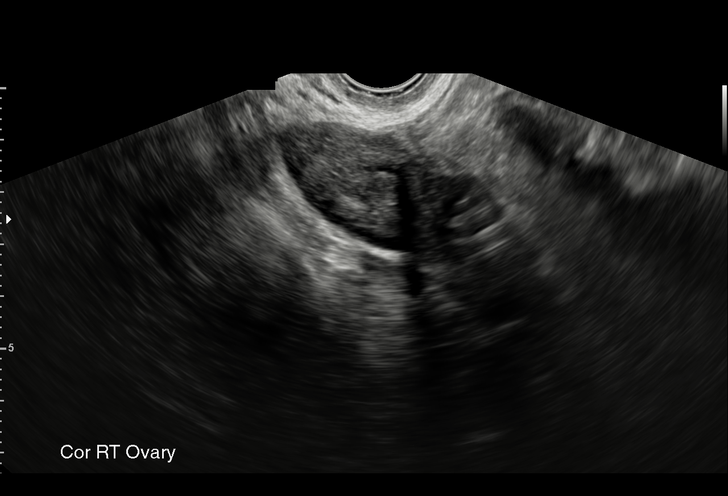
[im 39/50]
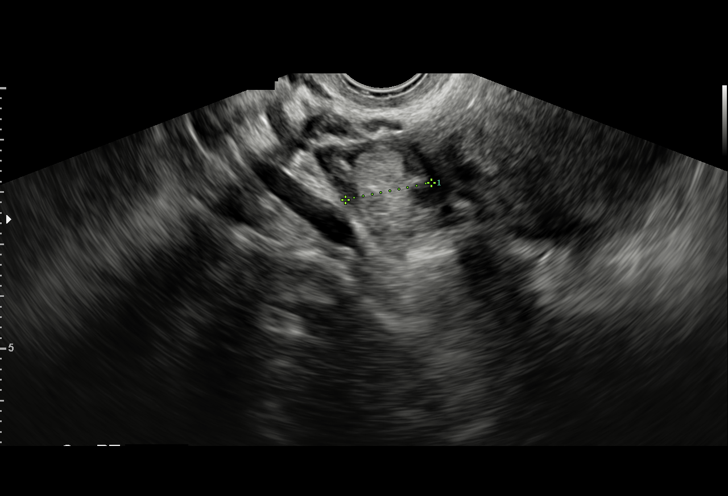
[im 42/50]
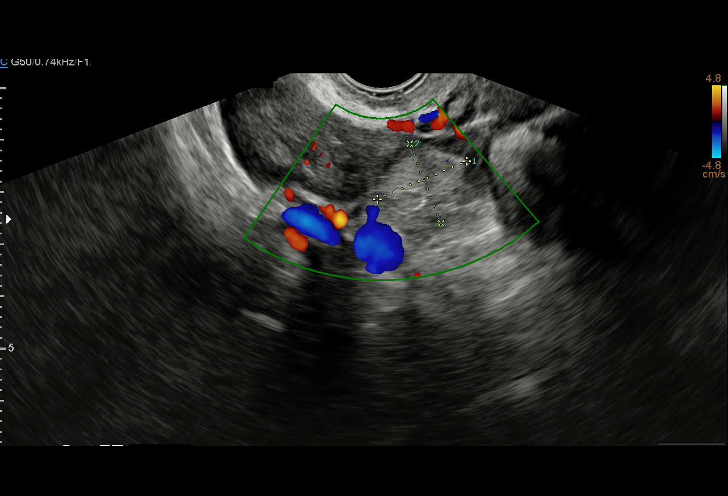
[im 46/50]
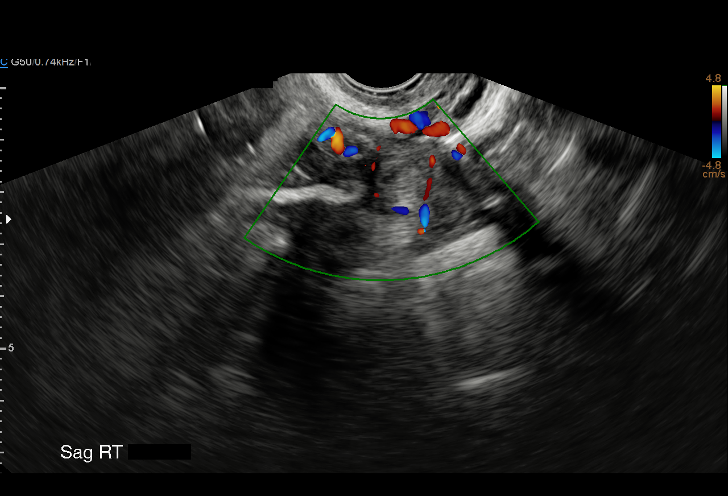
[im 50/50]
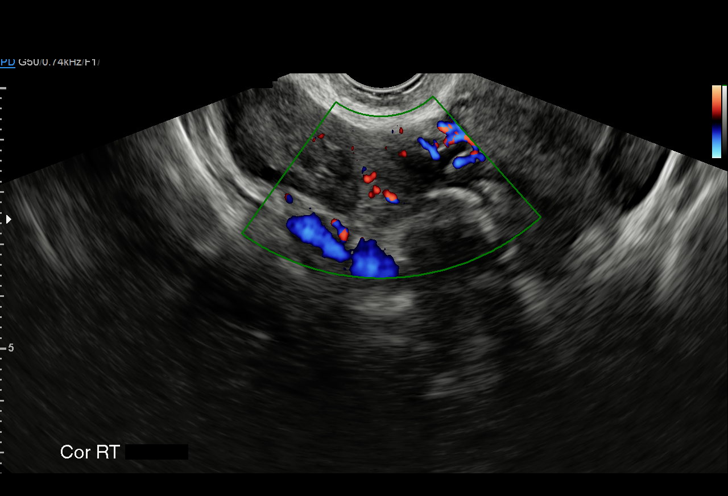

[15 of 28 positions shown; findings below may reference images not displayed]

FINDINGS: Intrauterine gestational sac: None

Yolk sac:  Not Visualized.

Embryo:  Not Visualized.

Cardiac Activity: Not Visualized.

Heart Rate: Not applicable

Subchorionic hemorrhage:  None visualized.

Maternal uterus/adnexae: The endometrial lining is thin and
homogeneous in appearance. Anteverted uterus is noted. Trace free
fluid in the cul-de-sac, simple in appearance and not complex to
suggest hemorrhage. Medial to the right ovary is a solid-appearing
vascular abnormality measuring 2.3 x 1.6 x 1.7 cm, mixed in
echogenicity with areas of central echogenicity with surrounding
hypoechogenicity. Given that this appears new and with positive beta
HCG, this could potentially represent an ectopic pregnancy. The
right ovary measures approximately 3.7 x 2.3 x 1.8 cm and the left
measures 3.4 x 3.6 x 1.3 cm.
IMPRESSION: 1. New right adnexal masslike abnormality medial to the right ovary,
mixed echogenicity with vascularity. This measures approximately
x 1.6 x 1.7 cm and is suspicious for possible ectopic pregnancy.
Simple fluid in the cul-de-sac without evidence of active
hemorrhage.
2. No intrauterine pregnancy is noted. These results were called by
telephone at the time of interpretation on 05/09/2017 at [DATE] to
[REDACTED] QOMANDAN TIGER , who verbally acknowledged these results.

## 2020-03-09 ENCOUNTER — Other Ambulatory Visit: Payer: Self-pay

## 2020-03-09 ENCOUNTER — Telehealth (HOSPITAL_COMMUNITY): Payer: Self-pay | Admitting: Emergency Medicine

## 2020-03-09 ENCOUNTER — Ambulatory Visit (HOSPITAL_COMMUNITY)
Admission: EM | Admit: 2020-03-09 | Discharge: 2020-03-09 | Disposition: A | Discharge status: Home / Self Care | Payer: Medicaid Other | Attending: Student | Admitting: Student

## 2020-03-09 ENCOUNTER — Encounter (HOSPITAL_COMMUNITY): Payer: Self-pay

## 2020-03-09 DIAGNOSIS — M26609 Unspecified temporomandibular joint disorder, unspecified side: Secondary | ICD-10-CM | POA: Diagnosis not present

## 2020-03-09 DIAGNOSIS — Z3202 Encounter for pregnancy test, result negative: Secondary | ICD-10-CM

## 2020-03-09 DIAGNOSIS — Z972 Presence of dental prosthetic device (complete) (partial): Secondary | ICD-10-CM

## 2020-03-09 DIAGNOSIS — Z975 Presence of (intrauterine) contraceptive device: Secondary | ICD-10-CM

## 2020-03-09 LAB — POC URINE PREG, ED: Preg Test, Ur: NEGATIVE

## 2020-03-09 MED ORDER — TIZANIDINE HCL 2 MG PO CAPS: 2.0000 mg | ORAL_CAPSULE | Freq: Three times a day (TID) | ORAL | 0 refills | Status: DC

## 2020-03-09 MED ORDER — PREDNISONE 20 MG PO TABS: 20.0000 mg | ORAL_TABLET | Freq: Every day | ORAL | 0 refills | Status: AC

## 2020-03-09 MED ORDER — TIZANIDINE HCL 4 MG PO TABS: 2.0000 mg | ORAL_TABLET | Freq: Three times a day (TID) | ORAL | 0 refills | Status: DC

## 2020-03-09 NOTE — Telephone Encounter (Signed)
Changed to tablets to try to g et patient better coverage with insurance

## 2020-03-09 NOTE — ED Provider Notes (Signed)
[facility]    CSN: [id number] Arrival date & time: 03/09/20  4098      History   Chief Complaint Chief Complaint  Patient presents with  . Jaw Pain    HPI Tonya Gonzales is a 30 y.o. female presented with jaw pain and concern for pregnancy.  History of ectopic pregnancy, tobacco abuse. Wears dentures. Pt c/o pain to left jaw, near ear, radiating to ear and forehead for approx 1.5 months. Pt states pain improves when she applies hand pressure to area, but is worse with movement of the jaw like with eating and talking. Reports swelling to left jaw this morning. 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 Took 1000mg  tylenol this morning at approx 0800 for pain relief with some improvement. Denies fever, runny nose, sore throat, cough, hearing changes, dizziness, tinnitus.  Pt also concerned b/c she has not had a period since January and states she normally has them even with the nexplanon.  HPI  Past Medical History:  Diagnosis Date  . Asthma   . Herpes     Patient Active Problem List   Diagnosis Date Noted  . 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 Tobacco abuse 05/09/2017  . Nexplanon in place 05/09/2017    Past Surgical History:  Procedure Laterality Date  . NO PAST SURGERIES    . TOOTH EXTRACTION      OB History    Gravida  3   Para  2   Term  2   Preterm      AB  1   Living  2     SAB      IAB      Ectopic  1   Multiple      Live Births           Obstetric Comments  Ectopic treated with MTX         Home Medications    Prior to Admission medications   Medication Sig Start Date End Date Taking? Authorizing Provider  predniSONE (DELTASONE) 20 MG tablet Take 1 tablet (20 mg total) by mouth daily for 5 days. 03/09/20 03/14/20 Yes 05/14/20, PA-C  tizanidine (ZANAFLEX) 2 MG capsule Take 1 capsule (2 mg total) by mouth 3 (three) times daily. 03/09/20  Yes 05/09/20, PA-C  dicyclomine  (BENTYL) 20 MG tablet Take 1 tablet (20 mg total) by mouth 2 (two) times daily. 01/12/18   03/13/18, MD  etonogestrel (NEXPLANON) 68 MG IMPL implant 1 each by Subdermal route once.    [provider]  omeprazole (PRILOSEC) 20 MG capsule Take 1 capsule (20 mg total) by mouth daily. 01/12/18 07/28/18  07/30/18, MD    Family History Family History  Problem Relation Age of Onset  . Hypertension Mother   . Diabetes Mother   . Diabetes Maternal Grandmother     Social History Social History   Tobacco Use  . Smoking status: Former Smoker    Packs/day: 0.50    Types: Cigarettes  . Smokeless tobacco: Never Used  Substance Use Topics  . Alcohol use: Yes    Comment: RARE  . Drug use: Yes    Frequency: 7.0 times per week    Types: Marijuana     Allergies   Ibuprofen, Other, Latex, and Morphine and related   Review of Systems Review of Systems  Constitutional: Negative for appetite change, chills and fever.  HENT: Positive for ear  pain. Negative for congestion, facial swelling, rhinorrhea, sinus pressure, sinus pain and sore throat.   Eyes: Negative for redness and visual disturbance.  Respiratory: Negative for cough, chest tightness, shortness of breath and wheezing.   Cardiovascular: Negative for chest pain and palpitations.  Gastrointestinal: Negative for abdominal pain, constipation, diarrhea, nausea and vomiting.  Genitourinary: Positive for menstrual problem. Negative for dysuria, frequency and urgency.  Musculoskeletal: Negative for myalgias.  Neurological: Negative for dizziness, weakness and headaches.  Psychiatric/Behavioral: Negative for confusion.  All other systems reviewed and are negative.    Physical Exam Triage Vital Signs ED Triage Vitals  Enc Vitals Group     BP 03/09/20 1023 (!) 120/94     Pulse Rate 03/09/20 1023 78     Resp 03/09/20 1023 18     Temp 03/09/20 1023 98.5 F (36.9 C)     Temp Source 03/09/20 1023 Oral     SpO2  03/09/20 1023 100 %     Weight --      Height --      Head Circumference --      Peak Flow --      Pain Score 03/09/20 1020 0     Pain Loc --      Pain Edu? --      Excl. in GC? --    No data found.  Updated Vital Signs BP (!) 120/94 (BP Location: Right Arm)   Pulse 78   Temp 98.5 F (36.9 C) (Oral)   Resp 18   LMP 01/07/2020 (Approximate)   SpO2 100%   Visual Acuity Right Eye Distance:   Left Eye Distance:   Bilateral Distance:    Right Eye Near:   Left Eye Near:    Bilateral Near:     Physical Exam Vitals reviewed.  Constitutional:      Appearance: Normal appearance.  HENT:     Head: Normocephalic and atraumatic.     Comments: L TMJ with crepitus on movement. TTP.    Nose: Nose normal.     Mouth/Throat:     Mouth: Mucous membranes are moist.     Dentition: No dental tenderness, gingival swelling or dental abscesses.     Pharynx: Oropharynx is clear. Uvula midline. No posterior oropharyngeal erythema.     Tonsils: No tonsillar exudate.     Comments: Dentures in place Eyes:     Extraocular Movements: Extraocular movements intact.     Pupils: Pupils are equal, round, and reactive to light.  Cardiovascular:     Rate and Rhythm: Normal rate and regular rhythm.     Heart sounds: Normal heart sounds.  Pulmonary:     Effort: Pulmonary effort is normal.     Breath sounds: Normal breath sounds.  Abdominal:     Palpations: Abdomen is soft.     Tenderness: There is no abdominal tenderness. There is no guarding or rebound.  Lymphadenopathy:     Cervical: No cervical adenopathy.  Skin:    General: Skin is warm.  Neurological:     General: No focal deficit present.     Mental Status: She is alert and oriented to person, place, and time.  Psychiatric:        Mood and Affect: Mood normal.        Behavior: Behavior normal.        Thought Content: Thought content normal.        Judgment: Judgment normal.      UC Treatments / Results  Labs (all  labs ordered are  listed, but only abnormal results are displayed) Labs Reviewed  POC URINE PREG, ED    EKG   Radiology No results found.  Procedures Procedures (including critical care time)  Medications Ordered in UC Medications - No data to display  Initial Impression / Assessment and Plan / UC Course  I have reviewed the triage vital signs and the nursing notes.  Pertinent labs & imaging results that were available during my care of the patient were reviewed by me and considered in my medical decision making (see chart for details).      This patient is a 30 year old female presenting with TMJ.  Prednisone and zanaflex sent as below.  Return precautions discussed. rec f/u with PCP or dentist if symptoms worsen/persist. She wears dentures at baseline.  Nexplanon for contraception. Urine pregnancy negative today.  This chart was dictated using voice recognition software, Dragon. Despite the best efforts of this provider to proofread and correct errors, errors may still occur which can change documentation meaning.  Final Clinical Impressions(s) / UC Diagnoses   Final diagnoses:  TMJ dysfunction  Negative pregnancy test  Nexplanon in place  Wears dentures     Discharge Instructions     -For your TMJ, start the prednisone.  You can take 2 pills together by mouth in the morning for 5 days.  This can give you energy, so do try to take in the morning. -Also start the muscle relaxer-tizanidine (Zanaflex).  You can take this up to 3 times daily.  This can make you drowsy, so you may want to take this at night or when you do not have to drive. -Try to stop grinding your teeth during the day. -If you continue to have this pain, you may want to follow-up with your primary care provider to discuss Botox injection of the TMJ joint or other alternative regimen.    ED Prescriptions    Medication Sig Dispense Auth. Provider   tizanidine (ZANAFLEX) 2 MG capsule Take 1 capsule (2 mg total) by  mouth 3 (three) times daily. 21 capsule Rhys Martini, PA-C   predniSONE (DELTASONE) 20 MG tablet Take 1 tablet (20 mg total) by mouth daily for 5 days. 5 tablet Rhys Martini, PA-C     PDMP not reviewed this encounter.   Rhys Martini, PA-C 03/09/20 1150

## 2020-03-09 NOTE — ED Triage Notes (Addendum)
Pt c/o pain to left jaw, near ear, radiating to head for approx 1.5 months. Pt states pain improves when she applies hand pressure to area.  Reports swelling to left jaw this morning.   Took 1000mg  tylenol this morning at approx 0800 for pain relief.  Denies fever, runny nose, sore throat, cough. Pt also concerned b/c she has not had a period since January and states she normally has them even with the nexplanon.

## 2020-03-09 NOTE — ED Notes (Signed)
Pt requested pregnancy test.

## 2020-03-09 NOTE — Discharge Instructions (Addendum)
-  For your TMJ, start the prednisone.  You can take 2 pills together by mouth in the morning for 5 days.  This can give you energy, so do try to take in the morning. -Also start the muscle relaxer-tizanidine (Zanaflex).  You can take this up to 3 times daily.  This can make you drowsy, so you may want to take this at night or when you do not have to drive. -Try to stop grinding your teeth during the day. -If you continue to have this pain, you may want to follow-up with your primary care provider to discuss Botox injection of the TMJ joint or other alternative regimen.

## 2020-04-23 ENCOUNTER — Other Ambulatory Visit: Payer: Self-pay

## 2020-04-23 ENCOUNTER — Ambulatory Visit (INDEPENDENT_AMBULATORY_CARE_PROVIDER_SITE_OTHER): Payer: Medicaid Other | Admitting: Primary Care

## 2020-04-23 ENCOUNTER — Encounter (INDEPENDENT_AMBULATORY_CARE_PROVIDER_SITE_OTHER): Payer: Self-pay | Admitting: Primary Care

## 2020-04-23 VITALS — BP 127/84 | HR 67 | Temp 97.7°F | Ht 63.0 in | Wt 141.4 lb

## 2020-04-23 DIAGNOSIS — Z131 Encounter for screening for diabetes mellitus: Secondary | ICD-10-CM

## 2020-04-23 DIAGNOSIS — N921 Excessive and frequent menstruation with irregular cycle: Secondary | ICD-10-CM

## 2020-04-23 DIAGNOSIS — Z7689 Persons encountering health services in other specified circumstances: Secondary | ICD-10-CM

## 2020-04-23 DIAGNOSIS — L709 Acne, unspecified: Secondary | ICD-10-CM | POA: Diagnosis not present

## 2020-04-23 LAB — POCT GLYCOSYLATED HEMOGLOBIN (HGB A1C): Hemoglobin A1C: 5.3 % (ref 4.0–5.6)

## 2020-04-23 NOTE — Progress Notes (Signed)
New Patient Office Visit  Subjective:  Patient ID: MAKYLA BYE, female    DOB: May 05, 1990  Age: 30 y.o. MRN: [id number]  CC:  Chief Complaint  Patient presents with  . New Patient (Initial Visit)    HPI Ms. DURINDA BUZZELLI presents is a 30 year old female who present for establishment of care.  Patient initially had appointment scheduled for ear pain jaw pain appointments was scheduled soon for and was seen at the urgent care and diagnosed with TMJ treated with prednisone and problem has resolved.  Asked patient did she grind her teeth yes suggested a mouthguard.  Mostly grinds her teeth when she is aggravated agitated or mad. Allergies  Allergen Reactions  . Ibuprofen Anaphylaxis    Sore throat associated.   . Other Anaphylaxis    All fruits cause sore throat   . Latex Hives and Rash    "burns skin"  . Morphine And Related Hives and Rash  Grinding her teeth Past Medical History:  Diagnosis Date  . Asthma   . Herpes     Past Surgical History:  Procedure Laterality Date  . NO PAST SURGERIES    . TOOTH EXTRACTION      Family History  Problem Relation Age of Onset  . Hypertension Mother   . Diabetes Mother   . Diabetes Maternal Grandmother     Social History   Socioeconomic History  . Marital status: Single    Spouse name: Not on file  . Number of children: Not on file  . Years of education: Not on file  . Highest education level: Not on file  Occupational History  . Not on file  Tobacco Use  . Smoking status: Former Smoker    Packs/day: 0.50    Types: Cigarettes  . Smokeless tobacco: Never Used  Vaping Use  . Vaping Use: Not on file  Substance and Sexual Activity  . Alcohol use: Yes    Comment: RARE  . Drug use: Yes    Frequency: 7.0 times per week    Types: Marijuana  . Sexual activity: Yes    Birth control/protection: Implant  Other Topics Concern  . Not on file  Social History Narrative  . Not on file   Social Determinants of Health    Financial Resource Strain: Not on file  Food Insecurity: Not on file  Transportation Needs: Not on file  Physical Activity: Not on file  Stress: Not on file  Social Connections: Not on file  Intimate Partner Violence: Not on file    ROS Review of Systems  Skin:       acne  All other systems reviewed and are negative.   Objective:   Today's Vitals: BP 127/84 (BP Location: Right Arm, Patient Position: Sitting, Cuff Size: Normal)   Pulse 67   Temp 97.7 F (36.5 C) (Temporal)   Ht 5\' 3"  (1.6 m)   Wt 141 lb 6.4 oz (64.1 kg)   LMP 04/21/2020 (Exact Date)   SpO2 98%   Breastfeeding No   BMI 25.05 kg/m   Physical Exam Vitals reviewed.  Constitutional:      Appearance: Normal appearance.  HENT:     Head: Normocephalic.     Right Ear: Tympanic membrane normal.     Left Ear: Tympanic membrane normal.     Nose: Nose normal.  Eyes:     Extraocular Movements: Extraocular movements intact.     Pupils: Pupils are equal, round, and reactive to light.  Cardiovascular:     Rate and Rhythm: Normal rate and regular rhythm.  Pulmonary:     Breath sounds: Normal breath sounds.  Abdominal:     General: Abdomen is flat. Bowel sounds are normal.     Palpations: Abdomen is soft.  Musculoskeletal:        General: Normal range of motion.     Cervical back: Normal range of motion and neck supple.  Skin:    General: Skin is warm and dry.  Neurological:     Mental Status: She is alert and oriented to person, place, and time.  Psychiatric:        Mood and Affect: Mood normal.        Behavior: Behavior normal.        Thought Content: Thought content normal.        Judgment: Judgment normal.     Assessment & Plan:  Nicole was seen today for new patient (initial visit).  Diagnoses and all orders for this visit:  Encounter to establish care Establish care  Screening for diabetes mellitus -     HgB A1c 5.3 per ADA guidelines patient is not a diabetic.  Acne, unspecified acne  type Referral to derm ingrown hairs recurrent than leave pigmentation     Outpatient Encounter Medications as of 04/23/2020  Medication Sig  . etonogestrel (NEXPLANON) 68 MG IMPL implant 1 each by Subdermal route once.  . [DISCONTINUED] dicyclomine (BENTYL) 20 MG tablet Take 1 tablet (20 mg total) by mouth 2 (two) times daily.  . [DISCONTINUED] omeprazole (PRILOSEC) 20 MG capsule Take 1 capsule (20 mg total) by mouth daily.  . [DISCONTINUED] tiZANidine (ZANAFLEX) 4 MG tablet Take 0.5 tablets (2 mg total) by mouth 3 (three) times daily.   No facility-administered encounter medications on file as of 04/23/2020.    Follow-up: Return if symptoms worsen or fail to improve.   Grayce Sessions, NP

## 2020-04-24 LAB — CMP14+EGFR
ALT: 11 IU/L (ref 0–32)
AST: 13 IU/L (ref 0–40)
Albumin/Globulin Ratio: 1.7 (ref 1.2–2.2)
Albumin: 5 g/dL (ref 3.9–5.0)
Alkaline Phosphatase: 91 IU/L (ref 44–121)
BUN/Creatinine Ratio: 5 — ABNORMAL LOW (ref 9–23)
BUN: 4 mg/dL — ABNORMAL LOW (ref 6–20)
Bilirubin Total: 0.4 mg/dL (ref 0.0–1.2)
CO2: 24 mmol/L (ref 20–29)
Calcium: 9.7 mg/dL (ref 8.7–10.2)
Chloride: 103 mmol/L (ref 96–106)
Creatinine, Ser: 0.75 mg/dL (ref 0.57–1.00)
Globulin, Total: 3 g/dL (ref 1.5–4.5)
Glucose: 87 mg/dL (ref 65–99)
Potassium: 4 mmol/L (ref 3.5–5.2)
Sodium: 144 mmol/L (ref 134–144)
Total Protein: 8 g/dL (ref 6.0–8.5)
eGFR: 110 mL/min/{1.73_m2} (ref >59)

## 2020-04-24 LAB — CBC WITH DIFFERENTIAL/PLATELET
Basophils Absolute: 0.1 10*3/uL (ref 0.0–0.2)
Basos: 1 %
EOS (ABSOLUTE): 0.2 10*3/uL (ref 0.0–0.4)
Eos: 4 %
Hematocrit: 44.5 % (ref 34.0–46.6)
Hemoglobin: 14.6 g/dL (ref 11.1–15.9)
Immature Grans (Abs): 0 10*3/uL (ref 0.0–0.1)
Immature Granulocytes: 0 %
Lymphocytes Absolute: 2.6 10*3/uL (ref 0.7–3.1)
Lymphs: 50 %
MCH: 30.5 pg (ref 26.6–33.0)
MCHC: 32.8 g/dL (ref 31.5–35.7)
MCV: 93 fL (ref 79–97)
Monocytes Absolute: 0.4 10*3/uL (ref 0.1–0.9)
Monocytes: 8 %
Neutrophils Absolute: 1.9 10*3/uL (ref 1.4–7.0)
Neutrophils: 37 %
Platelets: 268 10*3/uL (ref 150–450)
RBC: 4.78 x10E6/uL (ref 3.77–5.28)
RDW: 12.4 % (ref 11.7–15.4)
WBC: 5.2 10*3/uL (ref 3.4–10.8)

## 2020-04-29 ENCOUNTER — Encounter (INDEPENDENT_AMBULATORY_CARE_PROVIDER_SITE_OTHER): Payer: Self-pay

## 2020-06-01 ENCOUNTER — Encounter (HOSPITAL_COMMUNITY): Payer: Self-pay | Admitting: Emergency Medicine

## 2020-06-01 ENCOUNTER — Other Ambulatory Visit: Payer: Self-pay

## 2020-06-01 ENCOUNTER — Emergency Department (HOSPITAL_COMMUNITY)
Admission: EM | Admit: 2020-06-01 | Discharge: 2020-06-01 | Disposition: A | Discharge status: Home / Self Care | Payer: Medicaid Other | Attending: Emergency Medicine | Admitting: Emergency Medicine

## 2020-06-01 DIAGNOSIS — Z20822 Contact with and (suspected) exposure to covid-19: Secondary | ICD-10-CM | POA: Insufficient documentation

## 2020-06-01 DIAGNOSIS — J45909 Unspecified asthma, uncomplicated: Secondary | ICD-10-CM | POA: Insufficient documentation

## 2020-06-01 DIAGNOSIS — Z87891 Personal history of nicotine dependence: Secondary | ICD-10-CM | POA: Insufficient documentation

## 2020-06-01 DIAGNOSIS — Z91040 Latex allergy status: Secondary | ICD-10-CM | POA: Insufficient documentation

## 2020-06-01 DIAGNOSIS — R6889 Other general symptoms and signs: Secondary | ICD-10-CM

## 2020-06-01 DIAGNOSIS — M791 Myalgia, unspecified site: Secondary | ICD-10-CM | POA: Diagnosis not present

## 2020-06-01 LAB — URINALYSIS, ROUTINE W REFLEX MICROSCOPIC
Bilirubin Urine: NEGATIVE
Glucose, UA: NEGATIVE mg/dL
Ketones, ur: 80 mg/dL — AB
Leukocytes,Ua: NEGATIVE
Nitrite: NEGATIVE
Protein, ur: 100 mg/dL — AB
Specific Gravity, Urine: 1.023 (ref 1.005–1.030)
pH: 6 (ref 5.0–8.0)

## 2020-06-01 LAB — CBC WITH DIFFERENTIAL/PLATELET
Abs Immature Granulocytes: 0.01 10*3/uL (ref 0.00–0.07)
Basophils Absolute: 0 10*3/uL (ref 0.0–0.1)
Basophils Relative: 1 %
Eosinophils Absolute: 0 10*3/uL (ref 0.0–0.5)
Eosinophils Relative: 1 %
HCT: 42.5 % (ref 36.0–46.0)
Hemoglobin: 14.5 g/dL (ref 12.0–15.0)
Immature Granulocytes: 0 %
Lymphocytes Relative: 10 %
Lymphs Abs: 0.6 10*3/uL — ABNORMAL LOW (ref 0.7–4.0)
MCH: 30.9 pg (ref 26.0–34.0)
MCHC: 34.1 g/dL (ref 30.0–36.0)
MCV: 90.6 fL (ref 80.0–100.0)
Monocytes Absolute: 0.7 10*3/uL (ref 0.1–1.0)
Monocytes Relative: 12 %
Neutro Abs: 4.4 10*3/uL (ref 1.7–7.7)
Neutrophils Relative %: 76 %
Platelets: 215 10*3/uL (ref 150–400)
RBC: 4.69 MIL/uL (ref 3.87–5.11)
RDW: 12.1 % (ref 11.5–15.5)
WBC: 5.8 10*3/uL (ref 4.0–10.5)
nRBC: 0 % (ref 0.0–0.2)

## 2020-06-01 LAB — COMPREHENSIVE METABOLIC PANEL
ALT: 11 U/L (ref 0–44)
AST: 19 U/L (ref 15–41)
Albumin: 4.4 g/dL (ref 3.5–5.0)
Alkaline Phosphatase: 64 U/L (ref 38–126)
Anion gap: 9 (ref 5–15)
BUN: <5 mg/dL — ABNORMAL LOW (ref 6–20)
CO2: 22 mmol/L (ref 22–32)
Calcium: 9.5 mg/dL (ref 8.9–10.3)
Chloride: 104 mmol/L (ref 98–111)
Creatinine, Ser: 0.81 mg/dL (ref 0.44–1.00)
GFR, Estimated: >60 mL/min (ref >60)
Glucose, Bld: 99 mg/dL (ref 70–99)
Potassium: 3.1 mmol/L — ABNORMAL LOW (ref 3.5–5.1)
Sodium: 135 mmol/L (ref 135–145)
Total Bilirubin: 0.7 mg/dL (ref 0.3–1.2)
Total Protein: 7.5 g/dL (ref 6.5–8.1)

## 2020-06-01 LAB — RESP PANEL BY RT-PCR (FLU A&B, COVID) ARPGX2
Influenza A by PCR: NEGATIVE
Influenza B by PCR: NEGATIVE
SARS Coronavirus 2 by RT PCR: NEGATIVE

## 2020-06-01 LAB — LIPASE, BLOOD: Lipase: 23 U/L (ref 11–51)

## 2020-06-01 MED ORDER — TRAMADOL HCL 50 MG PO TABS
50.0000 mg | ORAL_TABLET | Freq: Once | ORAL | Status: AC
Start: 2020-06-01 — End: 2020-06-01
  Administered 2020-06-01: 50 mg via ORAL
  Filled 2020-06-01: qty 1

## 2020-06-01 MED ORDER — POTASSIUM CHLORIDE CRYS ER 20 MEQ PO TBCR
40.0000 meq | EXTENDED_RELEASE_TABLET | Freq: Once | ORAL | Status: AC
  Administered 2020-06-01: 40 meq via ORAL
  Filled 2020-06-01: qty 2

## 2020-06-01 MED ORDER — DOXYCYCLINE HYCLATE 100 MG PO CAPS: 100.0000 mg | ORAL_CAPSULE | Freq: Two times a day (BID) | ORAL | 0 refills | Status: AC

## 2020-06-01 MED ORDER — ACETAMINOPHEN 500 MG PO TABS
1000.0000 mg | ORAL_TABLET | Freq: Once | ORAL | Status: AC
  Administered 2020-06-01: 1000 mg via ORAL
  Filled 2020-06-01: qty 2

## 2020-06-01 NOTE — ED Triage Notes (Signed)
Pt crying on arrival.  Reports severe joint pain since waking up this morning.  Denies any other symptoms.  States she did have an "upset stomach" last night and didn't feel well.  Denies nausea, vomiting, and diarrhea.

## 2020-06-01 NOTE — ED Notes (Signed)
RN reviewed discharge instructions w/ pt. Follow up care and prescriptions reviewed, pt had no further questions. 

## 2020-06-01 NOTE — Discharge Instructions (Addendum)
Take tylenol for fever or pain.  I am treating you with doxycycline to make sure you are covered for any tickborne illnesses. Make sure to take this with food as it can cause an upset stomach. Also make sure that you use sunscreen and skin protection vigilantly if you go outside as it can increase significantly your chances for sunburn. If you continue to have symptoms you will need to follow-up with a primary care physician.  Return for the reasons listed below.  Contact a health care provider if: You have symptoms of a viral illness that do not go away. Your symptoms come back after going away. Your symptoms get worse. Get help right away if you have: Trouble breathing. A severe headache or a stiff neck. Severe vomiting or pain in your abdomen.

## 2020-06-01 NOTE — ED Provider Notes (Signed)
[facility] EMERGENCY DEPARTMENT Provider Note   CSN: [id number] Arrival date & time: 06/01/20  1051     History Chief Complaint  Patient presents with  . Joint Pain    Tonya Gonzales is a 30 y.o. female who presents emergency department with chief complaint of myalgias and arthralgias.  Patient states that yesterday she had an upset stomach and did not feel well.  When she awoke this morning she states that all of her bones, joints, muscles and skin were aching.  She is currently hyperventilating and states she feels like she is going to pass out and is crying tearfully.  I asked if she took anything for her pain and she states that her mother gave her a muscle relaxer but it did not help.  She denies urinary symptoms, nausea, vomiting, diarrhea, cough.  She is not vaccinated against the coronavirus.  Patient states that she cannot stop breathing fast because she is in too much pain.  She denies rashes or tick bites  HPI     Past Medical History:  Diagnosis Date  . Asthma   . Herpes     Patient Active Problem List   Diagnosis Date Noted  . 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 Tobacco abuse 05/09/2017  . Nexplanon in place 05/09/2017    Past Surgical History:  Procedure Laterality Date  . NO PAST SURGERIES    . TOOTH EXTRACTION       OB History    Gravida  3   Para  2   Term  2   Preterm      AB  1   Living  2     SAB      IAB      Ectopic  1   Multiple      Live Births           Obstetric Comments  Ectopic treated with MTX        Family History  Problem Relation Age of Onset  . Hypertension Mother   . Diabetes Mother   . Diabetes Maternal Grandmother     Social History   Tobacco Use  . Smoking status: Former Smoker    Packs/day: 0.50    Types: Cigarettes  . Smokeless tobacco: Never Used  Substance Use Topics  . Alcohol use: Yes    Comment: RARE  . Drug use: Yes    Frequency: 7.0 times per week    Types:  Marijuana    Home Medications Prior to Admission medications   Medication Sig Start Date End Date Taking? Authorizing Provider  etonogestrel (NEXPLANON) 68 MG IMPL implant 1 each by Subdermal route once.    [provider]  omeprazole (PRILOSEC) 20 MG capsule Take 1 capsule (20 mg total) by mouth daily. 01/12/18 07/28/18  Gwyneth Sprout, MD    Allergies    Ibuprofen, Other, Latex, and Morphine and related  Review of Systems   Review of Systems Ten systems reviewed and are negative for acute change, except as noted in the HPI.   Physical Exam Updated Vital Signs BP (!) 144/89 (BP Location: Right Arm)   Pulse (!) 115   Temp 100 F (37.8 C) (Oral)   Resp (!) 24   SpO2 100%   Physical Exam Vitals and nursing note reviewed.  Constitutional:      General: She is not in acute distress.    Appearance: She is well-developed. She is not toxic-appearing or diaphoretic.  HENT:  Head: Normocephalic and atraumatic.     Mouth/Throat:     Mouth: Mucous membranes are moist.  Eyes:     General: No scleral icterus.    Extraocular Movements: Extraocular movements intact.     Conjunctiva/sclera: Conjunctivae normal.     Pupils: Pupils are equal, round, and reactive to light.  Cardiovascular:     Rate and Rhythm: Normal rate and regular rhythm.     Heart sounds: Normal heart sounds. No murmur heard. No friction rub. No gallop.   Pulmonary:     Effort: Pulmonary effort is normal. No respiratory distress.     Breath sounds: Normal breath sounds.  Abdominal:     General: Bowel sounds are normal. There is no distension.     Palpations: Abdomen is soft. There is no mass.     Tenderness: There is no abdominal tenderness. There is no guarding.  Musculoskeletal:     Cervical back: Normal range of motion.  Skin:    General: Skin is warm and dry.  Neurological:     Mental Status: She is alert and oriented to person, place, and time.  Psychiatric:        Mood and Affect: Mood is  anxious. Affect is tearful.        Behavior: Behavior normal.     Comments: hyperventilating     ED Results / Procedures / Treatments   Labs (all labs ordered are listed, but only abnormal results are displayed) Labs Reviewed  RESP PANEL BY RT-PCR (FLU A&B, COVID) ARPGX2  CBC WITH DIFFERENTIAL/PLATELET  COMPREHENSIVE METABOLIC PANEL  URINALYSIS, ROUTINE W REFLEX MICROSCOPIC  LIPASE, BLOOD    EKG None  Radiology No results found.  Procedures Procedures   Medications Ordered in ED Medications  acetaminophen (TYLENOL) tablet 1,000 mg (1,000 mg Oral Given 06/01/20 1139)    ED Course  I have reviewed the triage vital signs and the nursing notes.  Pertinent labs & imaging results that were available during my care of the patient were reviewed by me and considered in my medical decision making (see chart for details).    MDM Rules/Calculators/A&P                          30 year old female here with flulike symptoms.  I ordered and reviewed labs that include CBC without elevated white blood cell count, mild lymphopenia .  CMP with mild hypokalemia, urine appears contaminated, lipase within normal limits.  Respiratory panel is negative for COVID or influenza.  Given the fact that the patient has flulike symptoms and so we will cover for tickborne illness with doxycycline.  She was given precautions to avoid sunlight and take with food.  She has improving vital signs here with decrease in her temperature, pulse and blood pressure.  Patient appears otherwise appropriate for discharge at this time Final Clinical Impression(s) / ED Diagnoses Final diagnoses:  Flu-like symptoms    Rx / DC Orders ED Discharge Orders    None       Arthor Captain, PA-C 06/01/20 1618    Pricilla Loveless, MD 06/06/20 [phone]

## 2020-06-02 ENCOUNTER — Telehealth: Payer: Self-pay

## 2020-06-02 NOTE — Telephone Encounter (Signed)
Please follow up

## 2020-06-02 NOTE — Telephone Encounter (Signed)
Copied from CRM [id number]. Topic: General - Other >> Jun 02, 2020 10:36 AM Jaquita Rector A wrote: Reason for CRM: Patient called in to inform Gwinda Passe that she was seen at ER on 06/01/20 but not sure what the issue is having fever, joint pains and nausea. Was given some antibiotics and think that is what is causing the nausea. No appointment until 06/23/20 Please advise  Ph# [phone]

## 2020-06-02 NOTE — Telephone Encounter (Signed)
Patient states she was not calling about lab results. Wanted to schedule appointment because she is still not feeling well even with taking antibiotics. Advised patient to complete course of antibiotics and scheduled for OV.

## 2020-06-02 NOTE — Telephone Encounter (Signed)
Called patient voice mail full. Reviewed labs potassium low sent in a supplement. Antibiotics needs to be take with food or before not an a empty stomach. Try this before stopping

## 2020-06-02 NOTE — Telephone Encounter (Signed)
Pt requested a call back to go over her labs, please advise.

## 2020-06-24 ENCOUNTER — Encounter (INDEPENDENT_AMBULATORY_CARE_PROVIDER_SITE_OTHER): Payer: Medicaid Other | Admitting: Primary Care

## 2020-06-24 ENCOUNTER — Other Ambulatory Visit: Payer: Self-pay

## 2020-07-05 NOTE — Progress Notes (Signed)
error 

## 2020-12-17 ENCOUNTER — Encounter: Payer: Medicaid Other | Admitting: Obstetrics and Gynecology

## 2020-12-25 ENCOUNTER — Encounter: Payer: Self-pay | Admitting: Certified Nurse Midwife

## 2020-12-25 ENCOUNTER — Other Ambulatory Visit: Payer: Self-pay

## 2020-12-25 ENCOUNTER — Ambulatory Visit: Payer: Medicaid Other | Admitting: Certified Nurse Midwife

## 2020-12-25 ENCOUNTER — Other Ambulatory Visit (HOSPITAL_COMMUNITY)
Admission: RE | Admit: 2020-12-25 | Discharge: 2020-12-25 | Disposition: A | Discharge status: Home / Self Care | Payer: Medicaid Other | Source: Ambulatory Visit | Attending: Certified Nurse Midwife | Admitting: Certified Nurse Midwife

## 2020-12-25 VITALS — BP 116/75 | HR 94 | Temp 98.3°F | Ht 63.0 in | Wt 142.6 lb

## 2020-12-25 DIAGNOSIS — N898 Other specified noninflammatory disorders of vagina: Secondary | ICD-10-CM | POA: Insufficient documentation

## 2020-12-25 NOTE — Progress Notes (Signed)
° °  SUBJECTIVE:  30 y.o. female complains of white and malodorous vaginal discharge for 1-2 week(s). Denies abnormal vaginal bleeding or significant pelvic pain or fever. No UTI symptoms. Denies history of known exposure to STD. Patient last sex for last night unprotected and 1 week ago prior unprotected. Patient reported taking 2 Plan B tablets last month.  Patient also in clinic for Nexplanon removal and reinsertion. Nexplanon expired 07/2020.  No LMP recorded. Patient has had an implant.  OBJECTIVE:  She appears well, afebrile. Urine dipstick: not done.  ASSESSMENT:  Vaginal Discharge  Vaginal Odor   PLAN:  GC, chlamydia, trichomonas, BVAG, CVAG probe sent to lab. Treatment: To be determined once lab results are received ROV prn if symptoms persist or worsen.  Patient rescheduled for 01/07/2021 for PAP/Nexplanon Removal/Reinsertion. Advised if she had sex before next appointment, to use condoms. Otherwise to avoid sexual intercourse.   Clovis Pu, RN

## 2020-12-30 LAB — CERVICOVAGINAL ANCILLARY ONLY
Bacterial Vaginitis (gardnerella): POSITIVE — AB
Candida Glabrata: NEGATIVE
Candida Vaginitis: NEGATIVE
Chlamydia: NEGATIVE
Comment: NEGATIVE
Comment: NEGATIVE
Comment: NEGATIVE
Comment: NEGATIVE
Comment: NEGATIVE
Comment: NORMAL
Neisseria Gonorrhea: NEGATIVE
Trichomonas: POSITIVE — AB

## 2021-01-01 ENCOUNTER — Encounter: Payer: Self-pay | Admitting: Certified Nurse Midwife

## 2021-01-04 ENCOUNTER — Encounter: Payer: Self-pay | Admitting: Certified Nurse Midwife

## 2021-01-04 MED ORDER — METRONIDAZOLE 500 MG PO TABS: 500.0000 mg | ORAL_TABLET | Freq: Two times a day (BID) | ORAL | 0 refills | Status: DC

## 2021-01-04 NOTE — Addendum Note (Signed)
Addended by: Gaylan Gerold on: 01/04/2021 09:07 AM   Modules accepted: Orders

## 2021-01-06 MED ORDER — METRONIDAZOLE 500 MG PO TABS: 500.0000 mg | ORAL_TABLET | Freq: Two times a day (BID) | ORAL | 0 refills | Status: DC

## 2021-01-06 NOTE — Addendum Note (Signed)
Addended by: Edd Arbour on: 01/06/2021 08:35 AM   Modules accepted: Orders

## 2021-01-07 ENCOUNTER — Other Ambulatory Visit (HOSPITAL_COMMUNITY)
Admission: RE | Admit: 2021-01-07 | Discharge: 2021-01-07 | Disposition: A | Discharge status: Home / Self Care | Payer: Medicaid Other | Source: Ambulatory Visit | Attending: Obstetrics and Gynecology | Admitting: Obstetrics and Gynecology

## 2021-01-07 ENCOUNTER — Other Ambulatory Visit: Payer: Self-pay

## 2021-01-07 ENCOUNTER — Encounter: Payer: Self-pay | Admitting: Obstetrics and Gynecology

## 2021-01-07 ENCOUNTER — Ambulatory Visit (INDEPENDENT_AMBULATORY_CARE_PROVIDER_SITE_OTHER): Payer: Medicaid Other | Admitting: Obstetrics and Gynecology

## 2021-01-07 VITALS — BP 126/80 | HR 87 | Temp 97.6°F | Ht 63.0 in | Wt 144.4 lb

## 2021-01-07 DIAGNOSIS — Z113 Encounter for screening for infections with a predominantly sexual mode of transmission: Secondary | ICD-10-CM | POA: Diagnosis present

## 2021-01-07 DIAGNOSIS — Z01419 Encounter for gynecological examination (general) (routine) without abnormal findings: Secondary | ICD-10-CM | POA: Diagnosis not present

## 2021-01-07 DIAGNOSIS — Z3046 Encounter for surveillance of implantable subdermal contraceptive: Secondary | ICD-10-CM

## 2021-01-07 DIAGNOSIS — Z3009 Encounter for other general counseling and advice on contraception: Secondary | ICD-10-CM | POA: Diagnosis not present

## 2021-01-07 LAB — POCT URINE PREGNANCY: Preg Test, Ur: NEGATIVE

## 2021-01-07 MED ORDER — ETONOGESTREL 68 MG ~~LOC~~ IMPL
68.0000 mg | DRUG_IMPLANT | Freq: Once | SUBCUTANEOUS | Status: AC
  Administered 2021-01-07: 68 mg via SUBCUTANEOUS

## 2021-01-07 NOTE — Progress Notes (Signed)
GYNECOLOGY OFFICE PROCEDURE NOTE   [name] is a 31 y.o. [phone] here for Nexplanon insertion. No complaints. UPT ordered today.  BP 126/80 (BP Location: Left Arm, Patient Position: Sitting, Cuff Size: Normal)    Pulse 87    Temp 97.6 F (36.4 C) (Oral)    Ht 5\' 3"  (1.6 m)    Wt 144 lb 6.4 oz (65.5 kg)    LMP 12/26/2020 (Exact Date)    BMI 25.58 kg/m    Results for orders placed or performed in visit on 01/07/21 (from the past 24 hour(s))  POCT urine pregnancy     Status: Normal   Collection Time: 01/07/21 11:04 AM  Result Value Ref Range   Preg Test, Ur Negative Negative      Nexplanon Insertion Procedure Patient identified, informed consent performed, consent signed.   Patient does understand that irregular bleeding is a very common side effect of this medication. She was advised to have backup contraception for one week after placement. Pregnancy test in clinic today was negative.  Appropriate time out taken.  Patient's left arm was prepped and draped in the usual sterile fashion. The ruler used to measure and mark insertion area.  Patient was prepped with alcohol swab and then injected with 3 ml of 1% lidocaine.  She was prepped with betadine, Nexplanon removed from packaging,  Device confirmed in needle, then inserted full length of needle and withdrawn per handbook instructions. Nexplanon was able to palpated in the patient's arm; patient palpated the insert herself. There was minimal blood loss.  Patient insertion site covered with guaze and a pressure bandage to reduce any bruising.  The patient tolerated the procedure well and was given post procedure instructions. Follow-up visit in 4 weeks; also TOC on same day.  Nexplanon Lot#: [id number]/07/21 / Expiration Date: 08/17/2022  08/19/2022, CNM  01/07/2021 11:36 AM

## 2021-01-07 NOTE — Progress Notes (Signed)
[name] is a [id number] female in the office today for removal of Nexplanon; which was inserted 07/13/2017 at [facility]. She desires to have it replaced today. She has no complaints today.  BP 126/80 (BP Location: Left Arm, Patient Position: Sitting, Cuff Size: Normal)    Pulse 87    Temp 97.6 F (36.4 C) (Oral)    Ht 5\' 3"  (1.6 m)    Wt 144 lb 6.4 oz (65.5 kg)    LMP 12/26/2020 (Exact Date)    BMI 25.58 kg/m    Procedure Note: Consent obtained and Time-Out conducted Implant palpated in left upper arm Betadine prep done on area of excision/removal Lidocaine infiltrated into intradermal and subcutaneous space Small 68mm incision made with scalpel Pressure applied to distal end of implant which exposed the tip through incision Tip of implant grasped with hemostat There was some adherence of implant in subcutaneous tissue.  Twisting and manipulation freed the implant from the capsule Implant removed Pressure held on incision until bleeding stopped Steristrips applied to incision Pressure dressing applied by RN Patient tolerated procedure well.   Assessment and Plan: Encounter for routine gynecological examination with Papanicolaou smear of cervix - Plan: Cytology - PAP( [place])  Encounter for counseling regarding contraception - Plan: POCT urine pregnancy    Time spent with patient doing procedure 10 minutes.  3m, CNM  01/07/2021 11:36 AM

## 2021-01-07 NOTE — Patient Instructions (Signed)
Nexplanon Instructions After Insertion   Keep bandage clean and dry for 24 hours   Keep the steri-strips on your arm until they fall off. This usually takes 1-2 wks. If they fall off before 1 week, replace them with the ones we provided you.   May use ice/Tylenol/Ibuprofen for soreness or pain   If you develop fever, drainage or increased warmth from incision site-contact office immediately   

## 2021-01-07 NOTE — Progress Notes (Signed)
° °  WELL-WOMAN PHYSICAL & PAP Patient name: Tonya Gonzales MRN [id number]  Date of birth: 1990-08-01 Chief Complaint:   Gynecologic Exam and Contraception  History of Present Illness:   Tonya Gonzales is a 31 y.o. [id number] African American female being seen today for a routine well-woman exam.  Current complaints: none.  PCP: Gwinda Passe, FNP      does not desire labs Patient's last menstrual period was 12/26/2020 (exact date). The current method of family planning is Nexplanon.  Last pap 08/17/2017. Results were: normal Last mammogram: n/a. N/aFamily h/o breast cancer: No Last colonoscopy: n/a. Family h/o colorectal cancer: No Review of Systems:   Pertinent items are noted in HPI Denies any headaches, blurred vision, fatigue, shortness of breath, chest pain, abdominal pain, abnormal vaginal discharge/itching/odor/irritation, problems with periods, bowel movements, urination, or intercourse unless otherwise stated above. Pertinent History Reviewed:  Reviewed past medical,surgical, social and family history.  Reviewed problem list, medications and allergies. Physical Assessment:   Vitals:   01/07/21 1030  BP: 126/80  Pulse: 87  Temp: 97.6 F (36.4 C)  TempSrc: Oral  Weight: 144 lb 6.4 oz (65.5 kg)  Height: 5\' 3"  (1.6 m)  Body mass index is 25.58 kg/m.        Physical Examination:   General appearance - well appearing, and in no distress  Mental status - alert, oriented to person, place, and time  Psych:  She has a normal mood and affect  Skin - warm and dry, normal color, no suspicious lesions noted  Chest - effort normal, all lung fields clear to auscultation bilaterally  Heart - normal rate and regular rhythm  Neck:  midline trachea, no thyromegaly or nodules  Breasts - breasts appear normal, no suspicious masses, no skin or nipple changes or  axillary nodes  Abdomen - soft, nontender, nondistended, no masses or organomegaly  Pelvic - VULVA: normal appearing vulva with  no masses, tenderness or lesions  VAGINA: normal appearing vagina with normal color and discharge, no lesions  CERVIX: normal appearing cervix without discharge or lesions, no CMT  Thin prep pap is done with HR HPV cotesting  UTERUS: uterus is felt to be normal size, shape, consistency and nontender   ADNEXA: No adnexal masses or tenderness noted.  Rectal - deferred  Extremities:  No swelling or varicosities noted  Results for orders placed or performed in visit on 01/07/21 (from the past 24 hour(s))  POCT urine pregnancy   Collection Time: 01/07/21 11:04 AM  Result Value Ref Range   Preg Test, Ur Negative Negative    Assessment & Plan:  1) Well-Woman Exam with Pap  2) Encounter for routine gynecological examination with Papanicolaou smear of cervix  - Cytology - PAP( [place])  3) Encounter for counseling regarding contraception  - POCT urine pregnancy,   4) Encounter for removal and reinsertion of Nexplanon  - etonogestrel (NEXPLANON) implant 68 mg  - See separate procedure notes for removal and reinsertion  Labs/procedures today: pap, Nexplanon removal/reinsertion  Mammogram at age 72 or sooner if problems Colonoscopy at age 51 or sooner if problems  Orders Placed This Encounter  Procedures   POCT urine pregnancy    Meds:  Meds ordered this encounter  Medications   etonogestrel (NEXPLANON) implant 68 mg    Follow-up: Return in about 4 weeks (around 02/04/2021) for Nexplanon 1 month check and TOC.  04/04/2021 MSN, CNM 01/07/2021 11:36 AM

## 2021-01-12 ENCOUNTER — Encounter: Payer: Self-pay | Admitting: Obstetrics and Gynecology

## 2021-01-12 LAB — CYTOLOGY - PAP
Comment: NEGATIVE
Diagnosis: UNDETERMINED — AB
High risk HPV: NEGATIVE

## 2021-02-04 ENCOUNTER — Other Ambulatory Visit (HOSPITAL_COMMUNITY)
Admission: RE | Admit: 2021-02-04 | Discharge: 2021-02-04 | Disposition: A | Discharge status: Home / Self Care | Payer: Medicaid Other | Source: Ambulatory Visit | Attending: Obstetrics and Gynecology | Admitting: Obstetrics and Gynecology

## 2021-02-04 ENCOUNTER — Other Ambulatory Visit: Payer: Self-pay

## 2021-02-04 ENCOUNTER — Ambulatory Visit (INDEPENDENT_AMBULATORY_CARE_PROVIDER_SITE_OTHER): Payer: Medicaid Other | Admitting: Obstetrics and Gynecology

## 2021-02-04 VITALS — BP 131/87 | Wt 147.0 lb

## 2021-02-04 DIAGNOSIS — N898 Other specified noninflammatory disorders of vagina: Secondary | ICD-10-CM | POA: Diagnosis present

## 2021-02-04 NOTE — Progress Notes (Signed)
Patient came in for a nurse visit for [facility] for trichomonas.  Patient stated no symptoms of vaginal discharge, vaginal order, or irritation.   Patient performed her own self swab and is aware the result will result in MyChart.   No additional questions or concerns.   Francisco Capuchin,  [place] 02/04/2021 1122

## 2021-02-05 LAB — CERVICOVAGINAL ANCILLARY ONLY
Bacterial Vaginitis (gardnerella): POSITIVE — AB
Candida Glabrata: NEGATIVE
Candida Vaginitis: NEGATIVE
Chlamydia: NEGATIVE
Comment: NEGATIVE
Comment: NEGATIVE
Comment: NEGATIVE
Comment: NEGATIVE
Comment: NEGATIVE
Comment: NORMAL
Neisseria Gonorrhea: NEGATIVE
Trichomonas: NEGATIVE

## 2021-02-09 ENCOUNTER — Other Ambulatory Visit: Payer: Self-pay | Admitting: Obstetrics and Gynecology

## 2021-02-09 DIAGNOSIS — N898 Other specified noninflammatory disorders of vagina: Secondary | ICD-10-CM

## 2021-02-09 DIAGNOSIS — B9689 Other specified bacterial agents as the cause of diseases classified elsewhere: Secondary | ICD-10-CM

## 2021-02-09 MED ORDER — METRONIDAZOLE 500 MG PO TABS: 500.0000 mg | ORAL_TABLET | Freq: Two times a day (BID) | ORAL | 0 refills | Status: AC

## 2023-08-08 ENCOUNTER — Emergency Department (HOSPITAL_COMMUNITY)
Admission: EM | Admit: 2023-08-08 | Discharge: 2023-08-09 | Disposition: A | Discharge status: Home / Self Care | Payer: Self-pay | Attending: Emergency Medicine | Admitting: Emergency Medicine

## 2023-08-08 DIAGNOSIS — M62830 Muscle spasm of back: Secondary | ICD-10-CM

## 2023-08-08 DIAGNOSIS — M542 Cervicalgia: Secondary | ICD-10-CM | POA: Insufficient documentation

## 2023-08-08 DIAGNOSIS — J45909 Unspecified asthma, uncomplicated: Secondary | ICD-10-CM | POA: Insufficient documentation

## 2023-08-08 DIAGNOSIS — Z91040 Latex allergy status: Secondary | ICD-10-CM | POA: Insufficient documentation

## 2023-08-08 DIAGNOSIS — M546 Pain in thoracic spine: Secondary | ICD-10-CM | POA: Insufficient documentation

## 2023-08-09 ENCOUNTER — Other Ambulatory Visit: Payer: Self-pay

## 2023-08-09 MED ORDER — METHOCARBAMOL 500 MG PO TABS: 500.0000 mg | ORAL_TABLET | Freq: Three times a day (TID) | ORAL | 0 refills | Status: AC | PRN

## 2023-08-09 NOTE — ED Notes (Signed)
Pt provided with heat pack.

## 2023-08-09 NOTE — ED Triage Notes (Signed)
 Patient reports pain at posterior neck radiating to upper back and right arm for several weeks , she suspects injury from lifting air conditioners and fell from her broken chair 2 weeks ago .

## 2023-08-09 NOTE — Discharge Instructions (Signed)
 You may use over-the-counter Acetaminophen  (Tylenol ), topical muscle creams such as SalonPas, [employer], Bengay, etc. Please stretch, apply ice or heat (whichever helps), and have massage therapy for additional assistance.  For pain control you may take 1000 mg of acetaminophen  (Tylenol ) every 8 hours as needed.  Please limit 24 hr usage of the following medicines: - Acetaminophen  (Tylenol ) to 4000 mg   Please note that other over-the-counter medicine may contain acetaminophen  as a component of their ingredients.

## 2023-08-09 NOTE — ED Provider Notes (Signed)
 [place] EMERGENCY DEPARTMENT AT [facility] Provider Note  CSN: [id number] Arrival date & time: 08/08/23 2341  Chief Complaint(s) Neck/Back Pain   HPI Tonya Gonzales is a 33 y.o. female here for several weeks of right-sided upper back/shoulder girdle pain.  Initially intermittent but has been consistent over the past several days.  Reports that at work she lifted some air conditioners and caught herself before falling off of a chair.  Pain is worse with certain range of motion's and palpation.  Denies any trauma. Pain improves with heat and menthol spray. Has also been taking OTC tylenol .  HPI  Past Medical History Past Medical History:  Diagnosis Date   Asthma    Herpes    Patient Active Problem List   Diagnosis Date Noted   Right tubal pregnancy 05/09/2017   Tobacco abuse 05/09/2017   Nexplanon  in place 05/09/2017   Home Medication(s) Prior to Admission medications   Medication Sig Start Date End Date Taking? Authorizing Provider  methocarbamol  (ROBAXIN ) 500 MG tablet Take 1-2 tablets (500-1,000 mg total) by mouth every 8 (eight) hours as needed for muscle spasms. 08/09/23  Yes Catarino Vold, Raynell Moder, MD  doxycycline  (VIBRAMYCIN ) 100 MG capsule Take 1 capsule (100 mg total) by mouth 2 (two) times daily. Patient not taking: Reported on 12/25/2020 06/01/20   Harris, Abigail, PA-C  etonogestrel  (NEXPLANON ) 68 MG IMPL implant 1 each by Subdermal route once. Patient not taking: Reported on 01/07/2021    [provider]  metroNIDAZOLE  (FLAGYL ) 500 MG tablet Take 1 tablet (500 mg total) by mouth 2 (two) times daily. 02/09/21   Dawson, Rolitta, CNM  omeprazole  (PRILOSEC) 20 MG capsule Take 1 capsule (20 mg total) by mouth daily. 01/12/18 07/28/18  Doretha Folks, MD                                                                                                                                    Allergies Ibuprofen , Other, Latex, and Morphine and codeine   Review of  Systems Review of Systems As noted in HPI  Physical Exam Vital Signs  I have reviewed the triage vital signs BP (!) 144/92   Pulse 74   Temp 97.6 F (36.4 C) (Temporal)   Resp 20   SpO2 100%   Physical Exam Vitals reviewed.  Constitutional:      General: She is not in acute distress.    Appearance: She is well-developed. She is not diaphoretic.  HENT:     Head: Normocephalic and atraumatic.     Right Ear: External ear normal.     Left Ear: External ear normal.     Nose: Nose normal.  Eyes:     General: No scleral icterus.    Conjunctiva/sclera: Conjunctivae normal.  Neck:     Trachea: Phonation normal.  Cardiovascular:     Rate and Rhythm: Normal rate and regular rhythm.  Pulmonary:     Effort:  Pulmonary effort is normal. No respiratory distress.     Breath sounds: No stridor.  Abdominal:     General: There is no distension.  Musculoskeletal:        General: Normal range of motion.     Cervical back: Normal range of motion. Tenderness present.     Thoracic back: Spasms present.       Back:  Neurological:     Mental Status: She is alert and oriented to person, place, and time.  Psychiatric:        Behavior: Behavior normal.     ED Results and Treatments Labs (all labs ordered are listed, but only abnormal results are displayed) Labs Reviewed - No data to display                                                                                                                       EKG  EKG Interpretation Date/Time:    Ventricular Rate:    PR Interval:    QRS Duration:    QT Interval:    QTC Calculation:   R Axis:      Text Interpretation:         Radiology No results found.  Medications Ordered in ED Medications - No data to display Procedures Procedures  (including critical care time) Medical Decision Making / ED Course   Medical Decision Making   Presentation is consistent with muscle strain/spasm of the shoulder girdle musculature.  No  trauma concerning for fracture or dislocation.  No fevers or chills concerning for meningitis.  Doubt pneumonia doubt acute arterial process.  Supportive management recommended.     Final Clinical Impression(s) / ED Diagnoses Final diagnoses:  Muscle spasm of back   The patient appears reasonably screened and/or stabilized for discharge and I doubt any other medical condition or other [facility] requiring further screening, evaluation, or treatment in the ED at this time. I have discussed the findings, Dx and Tx plan with the patient/family who expressed understanding and agree(s) with the plan. Discharge instructions discussed at length. The patient/family was given strict return precautions who verbalized understanding of the instructions. No further questions at time of discharge.  Disposition: Discharge  Condition: Good  ED Discharge Orders          Ordered    methocarbamol  (ROBAXIN ) 500 MG tablet  Every 8 hours PRN        08/09/23 0421             Follow Up: Primary care provider  Call  to schedule an appointment for close follow up    This chart was dictated using voice recognition software.  Despite best efforts to proofread,  errors can occur which can change the documentation meaning.    Trine Raynell Moder, MD 08/09/23 [phone]
